# Patient Record
Sex: Female | Born: 1950
Health system: Southern US, Community
[De-identification: ages and names within clinical notes are randomized; demographics above are authoritative.]

## PROBLEM LIST (undated history)

## (undated) DIAGNOSIS — K3532 Acute appendicitis with perforation and localized peritonitis, without abscess: Secondary | ICD-10-CM

## (undated) DIAGNOSIS — I2510 Atherosclerotic heart disease of native coronary artery without angina pectoris: Secondary | ICD-10-CM

## (undated) DIAGNOSIS — E785 Hyperlipidemia, unspecified: Secondary | ICD-10-CM

## (undated) DIAGNOSIS — I7090 Unspecified atherosclerosis: Secondary | ICD-10-CM

## (undated) HISTORY — DX: Hyperlipidemia, unspecified: E78.5

## (undated) HISTORY — DX: Atherosclerotic heart disease of native coronary artery without angina pectoris: I25.10

## (undated) HISTORY — DX: Acute appendicitis with perforation, localized peritonitis, and gangrene, without abscess: K35.32

## (undated) HISTORY — PX: APPENDECTOMY: SHX54

## (undated) HISTORY — DX: Unspecified atherosclerosis: I70.90

## (undated) HISTORY — PX: HERNIA REPAIR: SHX51

## (undated) HISTORY — PX: OTHER SURGICAL HISTORY: SHX169

---

## 1998-08-23 ENCOUNTER — Other Ambulatory Visit: Admission: RE | Admit: 1998-08-23 | Discharge: 1998-08-23 | Payer: Self-pay | Admitting: Obstetrics and Gynecology

## 1999-10-20 ENCOUNTER — Other Ambulatory Visit: Admission: RE | Admit: 1999-10-20 | Discharge: 1999-10-20 | Payer: Self-pay | Admitting: Obstetrics and Gynecology

## 2000-11-11 ENCOUNTER — Other Ambulatory Visit: Admission: RE | Admit: 2000-11-11 | Discharge: 2000-11-11 | Payer: Self-pay | Admitting: Obstetrics and Gynecology

## 2001-11-15 ENCOUNTER — Other Ambulatory Visit: Admission: RE | Admit: 2001-11-15 | Discharge: 2001-11-15 | Payer: Self-pay | Admitting: Obstetrics and Gynecology

## 2002-12-21 ENCOUNTER — Other Ambulatory Visit: Admission: RE | Admit: 2002-12-21 | Discharge: 2002-12-21 | Payer: Self-pay | Admitting: Obstetrics and Gynecology

## 2004-01-02 ENCOUNTER — Other Ambulatory Visit: Admission: RE | Admit: 2004-01-02 | Discharge: 2004-01-02 | Payer: Self-pay | Admitting: Obstetrics and Gynecology

## 2005-02-20 ENCOUNTER — Other Ambulatory Visit: Admission: RE | Admit: 2005-02-20 | Discharge: 2005-02-20 | Payer: Self-pay | Admitting: Obstetrics and Gynecology

## 2010-01-09 ENCOUNTER — Ambulatory Visit (HOSPITAL_COMMUNITY): Admission: AD | Admit: 2010-01-09 | Discharge: 2010-01-11 | Payer: Self-pay | Admitting: General Surgery

## 2010-08-22 ENCOUNTER — Other Ambulatory Visit (HOSPITAL_COMMUNITY): Payer: Self-pay | Admitting: Family Medicine

## 2010-08-22 ENCOUNTER — Other Ambulatory Visit (HOSPITAL_COMMUNITY)
Admission: RE | Admit: 2010-08-22 | Discharge: 2010-08-22 | Disposition: A | Discharge status: Home / Self Care | Payer: 59 | Source: Ambulatory Visit | Attending: Family Medicine | Admitting: Family Medicine

## 2010-08-22 ENCOUNTER — Other Ambulatory Visit: Payer: Self-pay | Admitting: Family Medicine

## 2010-08-22 DIAGNOSIS — Z1231 Encounter for screening mammogram for malignant neoplasm of breast: Secondary | ICD-10-CM

## 2010-08-22 DIAGNOSIS — Z124 Encounter for screening for malignant neoplasm of cervix: Secondary | ICD-10-CM | POA: Insufficient documentation

## 2010-08-22 DIAGNOSIS — Z1239 Encounter for other screening for malignant neoplasm of breast: Secondary | ICD-10-CM

## 2010-09-19 ENCOUNTER — Ambulatory Visit (HOSPITAL_COMMUNITY)
Admission: RE | Admit: 2010-09-19 | Discharge: 2010-09-19 | Disposition: A | Discharge status: Home / Self Care | Payer: 59 | Source: Ambulatory Visit | Attending: Family Medicine | Admitting: Family Medicine

## 2010-09-19 DIAGNOSIS — Z1231 Encounter for screening mammogram for malignant neoplasm of breast: Secondary | ICD-10-CM | POA: Insufficient documentation

## 2010-10-12 LAB — WOUND CULTURE: Culture: NO GROWTH

## 2010-10-12 LAB — CBC
HCT: 34 % — ABNORMAL LOW (ref 36.0–46.0)
MCV: 89 fL (ref 78.0–100.0)
Platelets: 179 10*3/uL (ref 150–400)
RBC: 3.8 MIL/uL — ABNORMAL LOW (ref 3.87–5.11)

## 2010-10-13 LAB — DIFFERENTIAL
Basophils Absolute: 0 10*3/uL (ref 0.0–0.1)
Basophils Relative: 1 % (ref 0–1)
Eosinophils Absolute: 0.2 10*3/uL (ref 0.0–0.7)
Eosinophils Relative: 3 % (ref 0–5)
Lymphocytes Relative: 41 % (ref 12–46)
Monocytes Relative: 7 % (ref 3–12)
Neutrophils Relative %: 48 % (ref 43–77)

## 2010-10-13 LAB — SURGICAL PCR SCREEN: Staphylococcus aureus: NEGATIVE

## 2010-10-13 LAB — CBC
HCT: 39 % (ref 36.0–46.0)
Platelets: 193 10*3/uL (ref 150–400)

## 2010-10-13 LAB — COMPREHENSIVE METABOLIC PANEL
Alkaline Phosphatase: 62 U/L (ref 39–117)
BUN: 17 mg/dL (ref 6–23)
Calcium: 9.5 mg/dL (ref 8.4–10.5)
GFR calc Af Amer: >60 mL/min (ref >60)
Potassium: 4.2 mEq/L (ref 3.5–5.1)
Total Bilirubin: 0.5 mg/dL (ref 0.3–1.2)
Total Protein: 7.2 g/dL (ref 6.0–8.3)

## 2011-08-21 ENCOUNTER — Other Ambulatory Visit (HOSPITAL_COMMUNITY): Payer: Self-pay | Admitting: Family Medicine

## 2011-08-21 DIAGNOSIS — Z1231 Encounter for screening mammogram for malignant neoplasm of breast: Secondary | ICD-10-CM

## 2011-09-21 ENCOUNTER — Ambulatory Visit (HOSPITAL_COMMUNITY)
Admission: RE | Admit: 2011-09-21 | Discharge: 2011-09-21 | Disposition: A | Discharge status: Home / Self Care | Payer: 59 | Source: Ambulatory Visit | Attending: Family Medicine | Admitting: Family Medicine

## 2011-09-21 DIAGNOSIS — Z1231 Encounter for screening mammogram for malignant neoplasm of breast: Secondary | ICD-10-CM | POA: Insufficient documentation

## 2012-09-01 ENCOUNTER — Other Ambulatory Visit (HOSPITAL_COMMUNITY): Payer: Self-pay | Admitting: Family Medicine

## 2012-09-01 DIAGNOSIS — Z1231 Encounter for screening mammogram for malignant neoplasm of breast: Secondary | ICD-10-CM

## 2012-09-30 ENCOUNTER — Ambulatory Visit (HOSPITAL_COMMUNITY): Payer: 59

## 2012-10-07 ENCOUNTER — Ambulatory Visit (HOSPITAL_COMMUNITY)
Admission: RE | Admit: 2012-10-07 | Discharge: 2012-10-07 | Disposition: A | Discharge status: Home / Self Care | Payer: 59 | Source: Ambulatory Visit | Attending: Family Medicine | Admitting: Family Medicine

## 2012-10-07 DIAGNOSIS — Z1231 Encounter for screening mammogram for malignant neoplasm of breast: Secondary | ICD-10-CM | POA: Insufficient documentation

## 2013-09-29 ENCOUNTER — Other Ambulatory Visit (HOSPITAL_COMMUNITY): Payer: Self-pay | Admitting: Family Medicine

## 2013-09-29 ENCOUNTER — Other Ambulatory Visit (HOSPITAL_COMMUNITY)
Admission: RE | Admit: 2013-09-29 | Discharge: 2013-09-29 | Disposition: A | Discharge status: Home / Self Care | Payer: 59 | Source: Ambulatory Visit | Attending: Family Medicine | Admitting: Family Medicine

## 2013-09-29 ENCOUNTER — Other Ambulatory Visit: Payer: Self-pay | Admitting: Family Medicine

## 2013-09-29 DIAGNOSIS — Z1231 Encounter for screening mammogram for malignant neoplasm of breast: Secondary | ICD-10-CM

## 2013-09-29 DIAGNOSIS — Z1151 Encounter for screening for human papillomavirus (HPV): Secondary | ICD-10-CM | POA: Insufficient documentation

## 2013-09-29 DIAGNOSIS — Z124 Encounter for screening for malignant neoplasm of cervix: Secondary | ICD-10-CM | POA: Insufficient documentation

## 2013-10-13 ENCOUNTER — Ambulatory Visit (HOSPITAL_COMMUNITY): Payer: 59

## 2013-10-20 ENCOUNTER — Ambulatory Visit (HOSPITAL_COMMUNITY)
Admission: RE | Admit: 2013-10-20 | Discharge: 2013-10-20 | Disposition: A | Discharge status: Home / Self Care | Payer: 59 | Source: Ambulatory Visit | Attending: Family Medicine | Admitting: Family Medicine

## 2013-10-20 DIAGNOSIS — Z1231 Encounter for screening mammogram for malignant neoplasm of breast: Secondary | ICD-10-CM | POA: Insufficient documentation

## 2014-09-25 ENCOUNTER — Other Ambulatory Visit (HOSPITAL_COMMUNITY): Payer: Self-pay | Admitting: Family Medicine

## 2014-09-25 DIAGNOSIS — Z1231 Encounter for screening mammogram for malignant neoplasm of breast: Secondary | ICD-10-CM

## 2014-10-15 ENCOUNTER — Ambulatory Visit
Admission: RE | Admit: 2014-10-15 | Discharge: 2014-10-15 | Disposition: A | Discharge status: Home / Self Care | Payer: 59 | Source: Ambulatory Visit | Attending: Family Medicine | Admitting: Family Medicine

## 2014-10-15 ENCOUNTER — Other Ambulatory Visit: Payer: Self-pay | Admitting: Family Medicine

## 2014-10-15 DIAGNOSIS — R05 Cough: Secondary | ICD-10-CM

## 2014-10-15 DIAGNOSIS — R059 Cough, unspecified: Secondary | ICD-10-CM

## 2014-10-26 ENCOUNTER — Ambulatory Visit (HOSPITAL_COMMUNITY): Payer: Self-pay

## 2014-10-26 ENCOUNTER — Ambulatory Visit (HOSPITAL_COMMUNITY)
Admission: RE | Admit: 2014-10-26 | Discharge: 2014-10-26 | Disposition: A | Discharge status: Home / Self Care | Payer: 59 | Source: Ambulatory Visit | Attending: Family Medicine | Admitting: Family Medicine

## 2014-10-26 DIAGNOSIS — Z1231 Encounter for screening mammogram for malignant neoplasm of breast: Secondary | ICD-10-CM | POA: Diagnosis not present

## 2015-02-01 ENCOUNTER — Other Ambulatory Visit: Payer: Self-pay | Admitting: Gastroenterology

## 2015-04-11 ENCOUNTER — Other Ambulatory Visit (HOSPITAL_COMMUNITY): Payer: Self-pay | Admitting: Family Medicine

## 2015-04-11 DIAGNOSIS — Z122 Encounter for screening for malignant neoplasm of respiratory organs: Secondary | ICD-10-CM

## 2015-04-11 DIAGNOSIS — F17211 Nicotine dependence, cigarettes, in remission: Secondary | ICD-10-CM

## 2015-10-16 ENCOUNTER — Other Ambulatory Visit: Payer: Self-pay

## 2015-10-16 DIAGNOSIS — Z1231 Encounter for screening mammogram for malignant neoplasm of breast: Secondary | ICD-10-CM

## 2015-10-21 ENCOUNTER — Other Ambulatory Visit: Payer: Self-pay | Admitting: Family Medicine

## 2015-10-21 DIAGNOSIS — R9389 Abnormal findings on diagnostic imaging of other specified body structures: Secondary | ICD-10-CM

## 2015-11-08 ENCOUNTER — Ambulatory Visit
Admission: RE | Admit: 2015-11-08 | Discharge: 2015-11-08 | Disposition: A | Discharge status: Home / Self Care | Payer: 59 | Source: Ambulatory Visit | Attending: Family Medicine | Admitting: Family Medicine

## 2015-11-08 ENCOUNTER — Ambulatory Visit
Admission: RE | Admit: 2015-11-08 | Discharge: 2015-11-08 | Disposition: A | Discharge status: Home / Self Care | Payer: 59 | Source: Ambulatory Visit

## 2015-11-08 DIAGNOSIS — R9389 Abnormal findings on diagnostic imaging of other specified body structures: Secondary | ICD-10-CM

## 2015-11-08 DIAGNOSIS — Z1231 Encounter for screening mammogram for malignant neoplasm of breast: Secondary | ICD-10-CM

## 2015-11-25 ENCOUNTER — Ambulatory Visit (INDEPENDENT_AMBULATORY_CARE_PROVIDER_SITE_OTHER): Payer: 59 | Admitting: Internal Medicine

## 2015-11-25 ENCOUNTER — Encounter: Payer: Self-pay | Admitting: Internal Medicine

## 2015-11-25 VITALS — BP 120/60 | HR 64 | Ht 67.0 in | Wt 191.0 lb

## 2015-11-25 DIAGNOSIS — I2510 Atherosclerotic heart disease of native coronary artery without angina pectoris: Secondary | ICD-10-CM

## 2015-11-25 MED ORDER — ATORVASTATIN CALCIUM 20 MG PO TABS: 20.0000 mg | ORAL_TABLET | Freq: Every day | ORAL | Status: DC

## 2015-11-25 MED ORDER — ASPIRIN EC 81 MG PO TBEC: 81.0000 mg | DELAYED_RELEASE_TABLET | Freq: Every day | ORAL | Status: AC

## 2015-11-25 NOTE — Progress Notes (Signed)
Cardiology Office Note   Date:  11/25/2015   ID:  Heather Weaver, DOB 21-Oct-1950, MRN [id number]  PCP:  Jonathon Bellows, MD  Cardiologist:   Dorris Carnes, MD   Chief Complaint  Patient presents with  . New Patient (Initial Visit)    referal for CAD    Abnormal chest CT  Referred by Dr Justin Mend     History of Present Illness: Heather Weaver is a 65 y.o. female with a history of abnormal CT  CT done for  F/u for a granuloma incidentaly found calcifications of arteries  8mod to severe)   Dizzy this weekend after long day at [facility] to [facility]     CXR and lab work neg  Felt to be due to dehydration  Given fluid and 4 ASA    Activites Master gardener volunteer   House work  No problems with that  NO SOB   No CP        No outpatient prescriptions prior to visit.   No facility-administered medications prior to visit.     Allergies:   Nickel   Past Medical History  Diagnosis Date  . Coronary artery disease   . Ruptured appendix   . Hyperlipidemia   . Atherosclerosis     Past Surgical History  Procedure Laterality Date  . Appendectomy    . Hernia repair    . Colocscopy       Social History:  The patient  reports that she has quit smoking. She does not have any smokeless tobacco history on file.   Family History:  The patient's family history includes Colon cancer (age of onset: 51) in her brother; Emphysema in her mother; Healthy in her sister; Heart failure (age of onset: 27) in her father; Rheumatic fever (age of onset: 78) in her father.    ROS:  Please see the history of present illness. All other systems are reviewed and  Negative to the above problem except as noted.    PHYSICAL EXAM: VS:  BP 120/60 mmHg  Pulse 64  Ht 5\' 7"  (1.702 m)  Wt 191 lb (86.637 kg)  BMI 29.91 kg/m2  GEN: Well nourished, well developed, in no acute distress HEENT: normal Neck: no JVD, carotid bruits, or masses Cardiac: RRR; no murmurs, rubs, or gallops,no edema    Respiratory:  clear to auscultation bilaterally, normal work of breathing GI: soft, nontender, nondistended, + BS  No hepatomegaly  MS: no deformity Moving all extremities   Skin: warm and dry, no rash Neuro:  Strength and sensation are intact Psych: euthymic mood, full affect   EKG:  EKG is ordered today.  SB 58 bpm     Lipid Panel No results found for: CHOL, TRIG, HDL, CHOLHDL, VLDL, LDLCALC, LDLDIRECT    Wt Readings from Last 3 Encounters:  11/25/15 191 lb (86.637 kg)      ASSESSMENT AND PLAN:  1  CAD  Pt with evid of CAD on chest CT  She does not appear to haave angina  Concerned about silent problems  ? If energy level ok I would recomm stress myovue to evaluate With CAD she should be on ecASA 81 mg   2  HL  LDL was 133 on recent check  SHould be down around 70  Would recomm lipitor 20 mg  F?U lipids in 8 wks with AST    F/U tentatively next winter     Signed, Dorris Carnes, MD  11/25/2015 5:04  PM    [facility] [place], [place], [place]  [address] Phone: [phone]; Fax: [phone]

## 2015-11-25 NOTE — Patient Instructions (Addendum)
Your physician has recommended you make the following change in your medication:  1.) start Lipitor (atorvastatin) 20 mg once a day in the evening (for cholesterol/CAD) 2.) stop aspirin 325 mg  3.) start aspirin 81 mg once a day  Your physician recommends that you return for lab work in: 8 weeks after starting Lipitor (lipids)  Your physician has requested that you have en exercise stress myoview. For further information please visit [email or web address]. Please follow instruction sheet, as given.  Your physician wants you to follow-up in: 6 months with Dr. Harrington Challenger.  You will receive a reminder letter in the mail two months in advance. If you don't receive a letter, please call our office to schedule the follow-up appointment.

## 2015-12-11 ENCOUNTER — Telehealth (HOSPITAL_COMMUNITY): Payer: Self-pay | Admitting: *Deleted

## 2015-12-11 NOTE — Telephone Encounter (Signed)
Patient given detailed instructions per Myocardial Perfusion Study Information Sheet for the test on 12/16/15. Patient notified to arrive 15 minutes early and that it is imperative to arrive on time for appointment to keep from having the test rescheduled.  If you need to cancel or reschedule your appointment, please call the office within 24 hours of your appointment. Failure to do so may result in a cancellation of your appointment, and a $50 no show fee. Patient verbalized understanding.Deania Siguenza J Michelangelo Rindfleisch, RN  

## 2015-12-16 ENCOUNTER — Ambulatory Visit (HOSPITAL_COMMUNITY): Payer: 59 | Attending: Cardiology

## 2015-12-16 DIAGNOSIS — I2510 Atherosclerotic heart disease of native coronary artery without angina pectoris: Secondary | ICD-10-CM | POA: Diagnosis not present

## 2015-12-16 DIAGNOSIS — R42 Dizziness and giddiness: Secondary | ICD-10-CM | POA: Insufficient documentation

## 2015-12-16 LAB — MYOCARDIAL PERFUSION IMAGING
CHL CUP NUCLEAR SDS: 0
CHL CUP NUCLEAR SRS: 0
CHL CUP RESTING HR STRESS: 58 {beats}/min
CSEPED: 6 min
CSEPEDS: 0 s
Estimated workload: 7 METS
LV dias vol: 148 mL (ref 46–106)
LV sys vol: 75 mL
MPHR: 156 {beats}/min
NUC STRESS TID: 1.06
Peak HR: 142 {beats}/min
Percent HR: 91 %
RATE: 0.23
RPE: 18
SSS: 0

## 2015-12-16 MED ORDER — TECHNETIUM TC 99M TETROFOSMIN IV KIT
11.0000 | PACK | Freq: Once | INTRAVENOUS | Status: AC | PRN
  Administered 2015-12-16: 11 via INTRAVENOUS
  Filled 2015-12-16: qty 11

## 2015-12-16 MED ORDER — TECHNETIUM TC 99M TETROFOSMIN IV KIT
32.5000 | PACK | Freq: Once | INTRAVENOUS | Status: AC | PRN
  Administered 2015-12-16: 33 via INTRAVENOUS
  Filled 2015-12-16: qty 33

## 2015-12-19 ENCOUNTER — Telehealth: Payer: Self-pay | Admitting: Internal Medicine

## 2015-12-19 DIAGNOSIS — I2510 Atherosclerotic heart disease of native coronary artery without angina pectoris: Secondary | ICD-10-CM

## 2015-12-19 NOTE — Telephone Encounter (Signed)
F/u  Pt returning RN phone call- myoview results. Please call back and discuss.   

## 2015-12-20 NOTE — Telephone Encounter (Signed)
Late entry for 12/19/15.  Pt informed of stress test results.  Notes Recorded by Fay Records, MD on 12/17/2015 at 4:07 PM Stress test shows normal flow at both rest and stress Pumping function was difficult to accurately calculate I would confirm LV pumping function with echo  Keep on same meds     We discussed lifestyle changes including low fat diet and exercise--including a walking plan. She would like a nutrition consult to discuss any possible diet changes. Order placed for referral to nutrition.  To Dr. Harrington Challenger for approval/any other recommendations.

## 2015-12-25 ENCOUNTER — Other Ambulatory Visit: Payer: Self-pay | Admitting: *Deleted

## 2015-12-25 DIAGNOSIS — I519 Heart disease, unspecified: Secondary | ICD-10-CM

## 2016-01-10 ENCOUNTER — Other Ambulatory Visit: Payer: Self-pay

## 2016-01-10 ENCOUNTER — Ambulatory Visit (HOSPITAL_COMMUNITY): Payer: 59 | Attending: Internal Medicine

## 2016-01-10 DIAGNOSIS — I509 Heart failure, unspecified: Secondary | ICD-10-CM | POA: Diagnosis not present

## 2016-01-10 DIAGNOSIS — Z8249 Family history of ischemic heart disease and other diseases of the circulatory system: Secondary | ICD-10-CM | POA: Insufficient documentation

## 2016-01-10 DIAGNOSIS — E785 Hyperlipidemia, unspecified: Secondary | ICD-10-CM | POA: Insufficient documentation

## 2016-01-10 DIAGNOSIS — I340 Nonrheumatic mitral (valve) insufficiency: Secondary | ICD-10-CM | POA: Diagnosis not present

## 2016-01-10 DIAGNOSIS — I5189 Other ill-defined heart diseases: Secondary | ICD-10-CM | POA: Insufficient documentation

## 2016-01-10 DIAGNOSIS — I071 Rheumatic tricuspid insufficiency: Secondary | ICD-10-CM | POA: Diagnosis not present

## 2016-01-10 DIAGNOSIS — Z87891 Personal history of nicotine dependence: Secondary | ICD-10-CM | POA: Insufficient documentation

## 2016-01-10 DIAGNOSIS — I358 Other nonrheumatic aortic valve disorders: Secondary | ICD-10-CM | POA: Diagnosis not present

## 2016-01-10 DIAGNOSIS — I519 Heart disease, unspecified: Secondary | ICD-10-CM | POA: Diagnosis not present

## 2016-01-10 DIAGNOSIS — I2510 Atherosclerotic heart disease of native coronary artery without angina pectoris: Secondary | ICD-10-CM | POA: Diagnosis not present

## 2016-01-10 DIAGNOSIS — I351 Nonrheumatic aortic (valve) insufficiency: Secondary | ICD-10-CM | POA: Insufficient documentation

## 2016-01-21 ENCOUNTER — Other Ambulatory Visit (INDEPENDENT_AMBULATORY_CARE_PROVIDER_SITE_OTHER): Payer: 59

## 2016-01-21 DIAGNOSIS — I2510 Atherosclerotic heart disease of native coronary artery without angina pectoris: Secondary | ICD-10-CM

## 2016-01-21 LAB — LIPID PANEL
CHOL/HDL RATIO: 2.5 ratio (ref <=5.0)
Cholesterol: 138 mg/dL (ref 125–200)
HDL: 55 mg/dL (ref >=46)
LDL Cholesterol: 71 mg/dL (ref <130)
Triglycerides: 60 mg/dL (ref <150)
VLDL: 12 mg/dL (ref <30)

## 2016-01-22 ENCOUNTER — Other Ambulatory Visit: Payer: Self-pay | Admitting: *Deleted

## 2016-01-22 DIAGNOSIS — I2510 Atherosclerotic heart disease of native coronary artery without angina pectoris: Secondary | ICD-10-CM

## 2016-01-22 MED ORDER — ATORVASTATIN CALCIUM 20 MG PO TABS: 20.0000 mg | ORAL_TABLET | Freq: Every day | ORAL | Status: DC

## 2016-10-22 ENCOUNTER — Other Ambulatory Visit: Payer: Self-pay | Admitting: Family Medicine

## 2016-10-22 DIAGNOSIS — Z1231 Encounter for screening mammogram for malignant neoplasm of breast: Secondary | ICD-10-CM

## 2016-10-30 ENCOUNTER — Other Ambulatory Visit: Payer: Self-pay | Admitting: Family Medicine

## 2016-10-30 ENCOUNTER — Other Ambulatory Visit (HOSPITAL_COMMUNITY)
Admission: RE | Admit: 2016-10-30 | Discharge: 2016-10-30 | Disposition: A | Discharge status: Home / Self Care | Payer: Medicare Other | Source: Ambulatory Visit | Attending: Family Medicine | Admitting: Family Medicine

## 2016-10-30 DIAGNOSIS — Z01411 Encounter for gynecological examination (general) (routine) with abnormal findings: Secondary | ICD-10-CM | POA: Insufficient documentation

## 2016-10-30 DIAGNOSIS — Z1151 Encounter for screening for human papillomavirus (HPV): Secondary | ICD-10-CM | POA: Diagnosis present

## 2016-11-03 LAB — CYTOLOGY - PAP
Diagnosis: NEGATIVE
HPV: NOT DETECTED

## 2016-11-04 ENCOUNTER — Encounter: Payer: Self-pay | Admitting: Internal Medicine

## 2016-11-16 ENCOUNTER — Ambulatory Visit
Admission: RE | Admit: 2016-11-16 | Discharge: 2016-11-16 | Disposition: A | Discharge status: Home / Self Care | Payer: Medicare Other | Source: Ambulatory Visit | Attending: Family Medicine | Admitting: Family Medicine

## 2016-11-16 DIAGNOSIS — Z1231 Encounter for screening mammogram for malignant neoplasm of breast: Secondary | ICD-10-CM

## 2016-11-23 ENCOUNTER — Encounter: Payer: Self-pay | Admitting: Internal Medicine

## 2016-11-23 ENCOUNTER — Ambulatory Visit (INDEPENDENT_AMBULATORY_CARE_PROVIDER_SITE_OTHER): Payer: Medicare Other | Admitting: Internal Medicine

## 2016-11-23 VITALS — BP 112/62 | HR 70 | Ht 67.0 in | Wt 193.0 lb

## 2016-11-23 DIAGNOSIS — I2510 Atherosclerotic heart disease of native coronary artery without angina pectoris: Secondary | ICD-10-CM

## 2016-11-23 DIAGNOSIS — E782 Mixed hyperlipidemia: Secondary | ICD-10-CM

## 2016-11-23 NOTE — Patient Instructions (Signed)
Your physician recommends that you continue on your current medications as directed. Please refer to the Current Medication list given to you today. Your physician wants you to follow-up in: 1 YEAR WITH DR. ROSS.  You will receive a reminder letter in the mail two months in advance. If you don't receive a letter, please call our office to schedule the follow-up appointment.  

## 2016-11-23 NOTE — Progress Notes (Addendum)
Cardiology Office Note   Date:  11/23/2016   ID:  Heather Weaver, DOB 10/09/1950, MRN [id number]  PCP:  Jonathon Bellows, MD  Cardiologist:   Dorris Carnes, MD   F/U of coronary calcifications       History of Present Illness: Heather Weaver is a 66 y.o. female with a history of abnormal CT  CT done for F/u of  a granuloma and coincidentaly found calcifications of arteries  (mod to severe) Pt had a myovue scan that showed norma perfusion  Echo showed normal pumping funciton    Since seen she deneis CP  Breathing is OK   She was at IM at [facility]  EKG was done  She was bradycardc with HR in 40s  Pt denies dizziness    Outpatient Medications Prior to Visit  Medication Sig Dispense Refill  . aspirin EC 81 MG tablet Take 1 tablet (81 mg total) by mouth daily. 90 tablet 3  . atorvastatin (LIPITOR) 20 MG tablet Take 1 tablet (20 mg total) by mouth daily. 90 tablet 3  . ibuprofen (ADVIL,MOTRIN) 200 MG tablet Take 200 mg by mouth as needed for headache or mild pain.     No facility-administered medications prior to visit.      Allergies:   Nickel   Past Medical History:  Diagnosis Date  . Atherosclerosis   . Coronary artery disease   . Hyperlipidemia   . Ruptured appendix     Past Surgical History:  Procedure Laterality Date  . APPENDECTOMY    . colocscopy    . HERNIA REPAIR       Social History:  The patient  reports that she has quit smoking. She has never used smokeless tobacco. She reports that she drinks alcohol. She reports that she does not use drugs.   Family History:  The patient's family history includes Colon cancer (age of onset: 60) in her brother; Emphysema in her mother; Healthy in her sister; Heart failure (age of onset: 35) in her father; Rheumatic fever (age of onset: 47) in her father.    ROS:  Please see the history of present illness. All other systems are reviewed and  Negative to the above problem except as noted.    PHYSICAL EXAM: VS:  BP 112/62   Pulse  70   Ht 5\' 7"  (1.702 m)   Wt 193 lb (87.5 kg)   SpO2 96%   BMI 30.23 kg/m   GEN: Well nourished, well developed, in no acute distress HEENT: normal Neck: no JVD, carotid bruits, or masses Cardiac: RRR; no murmurs, rubs, or gallops,no edema  Respiratory:  clear to auscultation bilaterally, normal work of breathing GI: soft, nontender, nondistended, + BS  No hepatomegaly  MS: no deformity Moving all extremities   Skin: warm and dry, no rash Neuro:  Strength and sensation are intact Psych: euthymic mood, full affect   EKG:  EKG is ordered today.  SB 58 bpm     Lipid Panel    Component Value Date/Time   CHOL 138 01/21/2016 0825   TRIG 60 01/21/2016 0825   HDL 55 01/21/2016 0825   CHOLHDL 2.5 01/21/2016 0825   VLDL 12 01/21/2016 0825   LDLCALC 71 01/21/2016 0825      Wt Readings from Last 3 Encounters:  11/23/16 193 lb (87.5 kg)  12/16/15 191 lb (86.6 kg)  11/25/15 191 lb (86.6 kg)      ASSESSMENT AND PLAN:  1  CAD  Pt with evid of  CAD on chest CT  No symptoms to sugg angina  Keep on same meds   2  HL  LDL was better  Keep on same regimne   3  Bradycardia  Pt asympotmatic  I would pursue anty testing unless dizzy, SOB F/U tentatively in 1 year   Signed, Dorris Carnes, MD  11/23/2016 3:12 PM    [facility] [place], [place], [place]  [address] Phone: [phone]; Fax: [phone]

## 2017-02-08 ENCOUNTER — Other Ambulatory Visit: Payer: Self-pay | Admitting: Internal Medicine

## 2017-02-08 DIAGNOSIS — I2510 Atherosclerotic heart disease of native coronary artery without angina pectoris: Secondary | ICD-10-CM

## 2017-10-04 ENCOUNTER — Other Ambulatory Visit: Payer: Self-pay | Admitting: Family Medicine

## 2017-10-04 DIAGNOSIS — Z1231 Encounter for screening mammogram for malignant neoplasm of breast: Secondary | ICD-10-CM

## 2017-11-07 ENCOUNTER — Other Ambulatory Visit: Payer: Self-pay | Admitting: Internal Medicine

## 2017-11-07 DIAGNOSIS — I2510 Atherosclerotic heart disease of native coronary artery without angina pectoris: Secondary | ICD-10-CM

## 2017-11-22 ENCOUNTER — Ambulatory Visit
Admission: RE | Admit: 2017-11-22 | Discharge: 2017-11-22 | Disposition: A | Discharge status: Home / Self Care | Payer: Medicare Other | Source: Ambulatory Visit | Attending: Family Medicine | Admitting: Family Medicine

## 2017-11-22 DIAGNOSIS — Z1231 Encounter for screening mammogram for malignant neoplasm of breast: Secondary | ICD-10-CM

## 2017-12-14 ENCOUNTER — Other Ambulatory Visit: Payer: Self-pay

## 2017-12-14 DIAGNOSIS — I2510 Atherosclerotic heart disease of native coronary artery without angina pectoris: Secondary | ICD-10-CM

## 2017-12-14 MED ORDER — ATORVASTATIN CALCIUM 20 MG PO TABS: 20.0000 mg | ORAL_TABLET | Freq: Every day | ORAL | 0 refills | Status: DC

## 2017-12-16 ENCOUNTER — Other Ambulatory Visit: Payer: Self-pay

## 2017-12-16 DIAGNOSIS — I2510 Atherosclerotic heart disease of native coronary artery without angina pectoris: Secondary | ICD-10-CM

## 2017-12-16 MED ORDER — ATORVASTATIN CALCIUM 20 MG PO TABS: 20.0000 mg | ORAL_TABLET | Freq: Every day | ORAL | 0 refills | Status: DC

## 2018-01-16 ENCOUNTER — Ambulatory Visit (HOSPITAL_COMMUNITY)
Admission: EM | Admit: 2018-01-16 | Discharge: 2018-01-16 | Disposition: A | Discharge status: Home / Self Care | Payer: Medicare Other | Attending: Family Medicine | Admitting: Family Medicine

## 2018-01-16 ENCOUNTER — Encounter (HOSPITAL_COMMUNITY): Payer: Self-pay

## 2018-01-16 ENCOUNTER — Other Ambulatory Visit: Payer: Self-pay

## 2018-01-16 DIAGNOSIS — R21 Rash and other nonspecific skin eruption: Secondary | ICD-10-CM | POA: Diagnosis not present

## 2018-01-16 MED ORDER — TRIAMCINOLONE ACETONIDE 0.1 % EX CREA: 1.0000 "application " | TOPICAL_CREAM | Freq: Two times a day (BID) | CUTANEOUS | 0 refills | Status: DC

## 2018-01-16 MED ORDER — CETIRIZINE HCL 10 MG PO TABS
10.0000 mg | ORAL_TABLET | Freq: Every day | ORAL | 0 refills | Status: DC
Start: 2018-01-16 — End: 2018-04-08

## 2018-01-16 NOTE — ED Triage Notes (Signed)
p presents to [facility] for possible reaction to insect bite since last night, pt has a red raised patch on left arm. Pt has applied neosporin on bite, has no relief

## 2018-01-16 NOTE — ED Notes (Signed)
Pt discharged by provider.

## 2018-01-16 NOTE — ED Provider Notes (Signed)
[facility]    CSN: [id number] Arrival date & time: 01/16/18  1336     History   Chief Complaint Chief Complaint  Patient presents with  . Insect Bite    HPI Heather Weaver is a 67 y.o. female.   67 year old female comes in for swelling, erythema, increased warmth of the left upper arm.  States she has had multiple insect bites on the body, and unsure if that was from a insect bite.  Mild itching.  States woke up this morning and noticed the swelling and erythema.  Area is tender to touch.  States since this morning, has slightly spread with more defined borders.  She denies fever, chills, night sweats.  Denies open wound.  Applied Neosporin to the area.     Past Medical History:  Diagnosis Date  . Atherosclerosis   . Coronary artery disease   . Hyperlipidemia   . Ruptured appendix     There are no active problems to display for this patient.   Past Surgical History:  Procedure Laterality Date  . APPENDECTOMY    . colocscopy    . HERNIA REPAIR      OB History   None      Home Medications    Prior to Admission medications   Medication Sig Start Date End Date Taking? Authorizing Provider  aspirin EC 81 MG tablet Take 1 tablet (81 mg total) by mouth daily. 11/25/15  Yes Fay Records, MD  atorvastatin (LIPITOR) 20 MG tablet Take 1 tablet (20 mg total) by mouth daily. Please keep upcoming appt 12/16/17  Yes Fay Records, MD  ibuprofen (ADVIL,MOTRIN) 200 MG tablet Take 200 mg by mouth as needed for headache or mild pain.   Yes [provider]  cetirizine (ZYRTEC) 10 MG tablet Take 1 tablet (10 mg total) by mouth daily. 01/16/18   Tasia Catchings, Breely Panik V, PA-C  triamcinolone cream (KENALOG) 0.1 % Apply 1 application topically 2 (two) times daily. 01/16/18   Ok Edwards, PA-C    Family History Family History  Problem Relation Age of Onset  . Emphysema Mother   . Heart failure Father 4       do to rheumastic fever  . Rheumatic fever Father 29  . Colon cancer  Brother 26  . Healthy Sister        age 30    Social History Social History   Tobacco Use  . Smoking status: Former Research scientist (life sciences)  . Smokeless tobacco: Never Used  Substance Use Topics  . Alcohol use: Yes    Alcohol/week: 0.0 oz  . Drug use: No     Allergies   Nickel   Review of Systems Review of Systems  Reason unable to perform ROS: See HPI as above.     Physical Exam Triage Vital Signs ED Triage Vitals  Enc Vitals Group     BP 01/16/18 1420 121/67     Pulse Rate 01/16/18 1420 60     Resp 01/16/18 1420 16     Temp 01/16/18 1420 98.4 F (36.9 C)     Temp Source 01/16/18 1420 Oral     SpO2 01/16/18 1420 98 %     Weight --      Height --      Head Circumference --      Peak Flow --      Pain Score 01/16/18 1421 0     Pain Loc --      Pain Edu? --  Excl. in GC? --    No data found.  Updated Vital Signs BP 121/67 (BP Location: Left Arm)   Pulse 60   Temp 98.4 F (36.9 C) (Oral)   Resp 16   SpO2 98%   Physical Exam  Constitutional: She is oriented to person, place, and time. She appears well-developed and well-nourished. No distress.  HENT:  Head: Normocephalic and atraumatic.  Eyes: Pupils are equal, round, and reactive to light. Conjunctivae are normal.  Neurological: She is alert and oriented to person, place, and time.  Skin: Skin is warm and dry.  See picture below.  Mild increased warmth.  No fluctuance felt.  Mild tenderness to palpation.          UC Treatments / Results  Labs (all labs ordered are listed, but only abnormal results are displayed) Labs Reviewed - No data to display  EKG None  Radiology No results found.  Procedures Procedures (including critical care time)  Medications Ordered in UC Medications - No data to display  Initial Impression / Assessment and Plan / UC Course  I have reviewed the triage vital signs and the nursing notes.  Pertinent labs & imaging results that were available during my care of the  patient were reviewed by me and considered in my medical decision making (see chart for details).    Discussed case with Dr. Joseph Art.  Area more concerning for local reaction versus cellulitis.  Will start Zyrtec, triamcinolone cream, ice compress.  Patient to continue to observe for streaking, worsening symptoms.  Other return precautions discussed.  Patient expresses understanding and agrees to plan.  Final Clinical Impressions(s) / UC Diagnoses   Final diagnoses:  Rash    ED Prescriptions    Medication Sig Dispense Auth. Provider   cetirizine (ZYRTEC) 10 MG tablet Take 1 tablet (10 mg total) by mouth daily. 15 tablet Shamanda Len V, PA-C   triamcinolone cream (KENALOG) 0.1 % Apply 1 application topically 2 (two) times daily. 30 g Tobin Chad, [place] 01/16/18 1802

## 2018-01-16 NOTE — Discharge Instructions (Addendum)
Affected region looks more like an allergic reaction/inflammation. Start zyrtec as directed. Triamcinolone cream. Ice compress. Follow up for reevaluation redness continues to spread, has streaking lines, fever.

## 2018-03-17 ENCOUNTER — Other Ambulatory Visit: Payer: Self-pay | Admitting: Internal Medicine

## 2018-03-17 ENCOUNTER — Encounter: Payer: Self-pay | Admitting: Internal Medicine

## 2018-03-17 DIAGNOSIS — I2510 Atherosclerotic heart disease of native coronary artery without angina pectoris: Secondary | ICD-10-CM

## 2018-04-08 ENCOUNTER — Ambulatory Visit (INDEPENDENT_AMBULATORY_CARE_PROVIDER_SITE_OTHER): Payer: Medicare Other | Admitting: Internal Medicine

## 2018-04-08 ENCOUNTER — Encounter: Payer: Self-pay | Admitting: Internal Medicine

## 2018-04-08 VITALS — BP 124/70 | HR 53 | Ht 67.0 in | Wt 181.6 lb

## 2018-04-08 DIAGNOSIS — E782 Mixed hyperlipidemia: Secondary | ICD-10-CM

## 2018-04-08 DIAGNOSIS — I2510 Atherosclerotic heart disease of native coronary artery without angina pectoris: Secondary | ICD-10-CM | POA: Diagnosis not present

## 2018-04-08 MED ORDER — ATORVASTATIN CALCIUM 20 MG PO TABS: 20.0000 mg | ORAL_TABLET | Freq: Every day | ORAL | 3 refills | Status: DC

## 2018-04-08 NOTE — Progress Notes (Signed)
Cardiology Office Note   Date:  04/08/2018   ID:  Heather Weaver, DOB Jul 22, 1951, MRN [id number]  PCP:  Maurice Small, MD  Cardiologist:   Dorris Carnes, MD   F/U of coronary calcifications       History of Present Illness: Heather Weaver is a 67 y.o. female with a history of abnormal CT  CT done for F/u of  a granuloma and coincidentaly found calcifications of arteries  (mod to severe) Pt had a myovue scan that showed norma perfusion  Echo showed normal pumping funciton    Since seen in 2018 she hs done well  No CP  Breathing is OK   No SOB   Active in gardeing   Has stairs to climb  Outpatient Medications Prior to Visit  Medication Sig Dispense Refill  . aspirin EC 81 MG tablet Take 1 tablet (81 mg total) by mouth daily. 90 tablet 3  . atorvastatin (LIPITOR) 20 MG tablet Take 1 tablet (20 mg total) by mouth daily. Please keep upcoming appointment for further refills 90 tablet 0  . cetirizine (ZYRTEC) 5 MG tablet Take 5 mg by mouth daily as needed for allergies.    Marland Kitchen ibuprofen (ADVIL,MOTRIN) 200 MG tablet Take 200 mg by mouth as needed for headache or mild pain.    . cetirizine (ZYRTEC) 10 MG tablet Take 1 tablet (10 mg total) by mouth daily. (Patient not taking: Reported on 04/08/2018) 15 tablet 0  . triamcinolone cream (KENALOG) 0.1 % Apply 1 application topically 2 (two) times daily. (Patient not taking: Reported on 04/08/2018) 30 g 0   No facility-administered medications prior to visit.      Allergies:   Nickel   Past Medical History:  Diagnosis Date  . Atherosclerosis   . Coronary artery disease   . Hyperlipidemia   . Ruptured appendix     Past Surgical History:  Procedure Laterality Date  . APPENDECTOMY    . colocscopy    . HERNIA REPAIR       Social History:  The patient  reports that she has quit smoking. She has never used smokeless tobacco. She reports that she drinks alcohol. She reports that she does not use drugs.   Family History:  The patient's family  history includes Colon cancer (age of onset: 19) in her brother; Emphysema in her mother; Healthy in her sister; Heart failure (age of onset: 63) in her father; Rheumatic fever (age of onset: 65) in her father.    ROS:  Please see the history of present illness. All other systems are reviewed and  Negative to the above problem except as noted.    PHYSICAL EXAM: VS:  BP 124/70   Pulse (!) 53   Ht 5\' 7"  (1.702 m)   Wt 181 lb 9.6 oz (82.4 kg)   BMI 28.44 kg/m   GEN: Well nourished, well developed, in no acute distress HEENT: normal Neck: JVP is normal  No, carotid bruits, or masses Cardiac: RRR; no murmurs, rubs, or gallops,no edema  Respiratory:  clear to auscultation bilaterally, normal work of breathing GI: soft, nontender, nondistended, + BS  No hepatomegaly  MS: no deformity Moving all extremities   Skin: warm and dry, no rash Neuro:  Strength and sensation are intact Psych: euthymic mood, full affect   EKG:  EKG is ordered today.  SB 53 bpm     Lipid Panel    Component Value Date/Time   CHOL 138 01/21/2016 0825   TRIG 60 01/21/2016  0825   HDL 55 01/21/2016 0825   CHOLHDL 2.5 01/21/2016 0825   VLDL 12 01/21/2016 0825   LDLCALC 71 01/21/2016 0825      Wt Readings from Last 3 Encounters:  04/08/18 181 lb 9.6 oz (82.4 kg)  11/23/16 193 lb (87.5 kg)  12/16/15 191 lb (86.6 kg)      ASSESSMENT AND PLAN:  1  CAD  Mod to severe calc ofcoronary arteries   No symtpoms of angina   Follow  Cont current meds   Will check to see if CBCdone   2  HL  LDL is good   68   HDL 64   Continue meds     3  Bradycardia  Pt asympotmatic Follow    F/U tentatively in 1 year   Signed, Dorris Carnes, MD  04/08/2018 10:25 AM    [facility] [place], [place], [place]  [address] Phone: [phone]; Fax: [phone]

## 2018-04-08 NOTE — Patient Instructions (Signed)
Your physician recommends that you continue on your current medications as directed. Please refer to the Current Medication list given to you today. Your physician wants you to follow-up in: 1 year with Dr. Ross.  You will receive a reminder letter in the mail two months in advance. If you don't receive a letter, please call our office to schedule the follow-up appointment.  

## 2018-12-30 ENCOUNTER — Other Ambulatory Visit: Payer: Self-pay

## 2018-12-30 ENCOUNTER — Ambulatory Visit
Admission: RE | Admit: 2018-12-30 | Discharge: 2018-12-30 | Disposition: A | Discharge status: Home / Self Care | Payer: Medicare Other | Source: Ambulatory Visit

## 2018-12-30 DIAGNOSIS — Z1231 Encounter for screening mammogram for malignant neoplasm of breast: Secondary | ICD-10-CM

## 2019-05-06 ENCOUNTER — Other Ambulatory Visit: Payer: Self-pay | Admitting: Internal Medicine

## 2019-05-06 DIAGNOSIS — I2510 Atherosclerotic heart disease of native coronary artery without angina pectoris: Secondary | ICD-10-CM

## 2019-07-06 ENCOUNTER — Encounter: Payer: Self-pay | Admitting: Internal Medicine

## 2019-07-06 ENCOUNTER — Other Ambulatory Visit: Payer: Self-pay

## 2019-07-06 ENCOUNTER — Ambulatory Visit (INDEPENDENT_AMBULATORY_CARE_PROVIDER_SITE_OTHER): Payer: Medicare Other | Admitting: Internal Medicine

## 2019-07-06 VITALS — BP 140/68 | HR 73 | Ht 67.0 in | Wt 185.0 lb

## 2019-07-06 DIAGNOSIS — I2510 Atherosclerotic heart disease of native coronary artery without angina pectoris: Secondary | ICD-10-CM

## 2019-07-06 NOTE — Progress Notes (Signed)
Cardiology Office Note   Date:  07/06/2019   ID:  Heather Weaver, DOB 1950/11/22, MRN [id number]  PCP:  Maurice Small, MD  Cardiologist:   Dorris Carnes, MD   F/U of coronary calcifications       History of Present Illness: Heather Weaver is a 67 y.o. female with a history of abnormal CT  CT done for F/u of  a granuloma and coincidentaly found calcifications of arteries  (mod to severe) Pt had a myovue scan that showed norma perfusion  Echo showed normal pumping funciton    Since she was last in clinic she has done well.  She is walking a lot particularly on the weekends.  She denies chest pain.  No dizziness.  No shortness of breath.  No lower extremity edema. Outpatient Medications Prior to Visit  Medication Sig Dispense Refill  . aspirin EC 81 MG tablet Take 1 tablet (81 mg total) by mouth daily. 90 tablet 3  . atorvastatin (LIPITOR) 20 MG tablet Take 1 tablet (20 mg total) by mouth daily. Please make overdue appt with Dr. Harrington Challenger before anymore refills. 1st attempt 30 tablet 0  . cetirizine (ZYRTEC) 5 MG tablet Take 5 mg by mouth daily as needed for allergies.    Marland Kitchen ibuprofen (ADVIL,MOTRIN) 200 MG tablet Take 200 mg by mouth as needed for headache or mild pain.     No facility-administered medications prior to visit.     Allergies:   Nickel   Past Medical History:  Diagnosis Date  . Atherosclerosis   . Coronary artery disease   . Hyperlipidemia   . Ruptured appendix     Past Surgical History:  Procedure Laterality Date  . APPENDECTOMY    . colocscopy    . HERNIA REPAIR       Social History:  The patient  reports that she has quit smoking. She has never used smokeless tobacco. She reports current alcohol use. She reports that she does not use drugs.   Family History:  The patient's family history includes Colon cancer (age of onset: 63) in her brother; Emphysema in her mother; Healthy in her sister; Heart failure (age of onset: 66) in her father; Rheumatic fever (age of  onset: 37) in her father.    ROS:  Please see the history of present illness. All other systems are reviewed and  Negative to the above problem except as noted.    PHYSICAL EXAM: VS:  BP 140/68   Pulse 73   Ht 5\' 7"  (1.702 m)   Wt 185 lb (83.9 kg)   BMI 28.98 kg/m   GEN: Well nourished, well developed, in no acute distress HEENT: normal Neck: JVP is normal  No, carotid bruits,  Cardiac: RRR; no murmurs, rubs, or gallops,no edema  Respiratory:  clear to auscultation bilaterally, normal work of breathing GI: soft, nontender, nondistended, + BS  No hepatomegaly  MS: no deformity Moving all extremities   Skin: warm and dry, no rash Neuro:  Strength and sensation are intact Psych: euthymic mood, full affect   EKG:  EKG is ordered today.  SR 73 bpm     Lipid Panel    Component Value Date/Time   CHOL 138 01/21/2016 0825   TRIG 60 01/21/2016 0825   HDL 55 01/21/2016 0825   CHOLHDL 2.5 01/21/2016 0825   VLDL 12 01/21/2016 0825   LDLCALC 71 01/21/2016 0825      Wt Readings from Last 3 Encounters:  07/06/19 185 lb (83.9 kg)  04/08/18 181 lb 9.6 oz (82.4 kg)  11/23/16 193 lb (87.5 kg)      ASSESSMENT AND PLAN:  1  CAD  Mod to severe calc ofcoronary arteries   remains asymptomatic and active.  Follow  2  HL LDL was a little higher in the 80s she admits to eating cheese recommended backing off 3  Bradycardia  HR is OK  4   Blood pressure  BP is up a little now   I recomm that she get a BP cuff to follow   She has an appt with C Justin Mend next year    Goal for BP 110s to low 130s/     F/U tentatively in 1 year   Signed, Dorris Carnes, MD  07/06/2019 4:26 PM    [facility] [place], [place], [place]  [address] Phone: [phone]; Fax: [phone]

## 2019-07-06 NOTE — Patient Instructions (Signed)
Medication Instructions:  No changes *If you need a refill on your cardiac medications before your next appointment, please call your pharmacy*  Lab Work: none If you have labs (blood work) drawn today and your tests are completely normal, you will receive your results only by: Marland Kitchen MyChart Message (if you have MyChart) OR . A paper copy in the mail If you have any lab test that is abnormal or we need to change your treatment, we will call you to review the results.  Testing/Procedures: none  Follow-Up: At [facility], you and your health needs are our priority.  As part of our continuing mission to provide you with exceptional heart care, we have created designated Provider Care Teams.  These Care Teams include your primary Cardiologist (physician) and Advanced Practice Providers (APPs -  Physician Assistants and Nurse Practitioners) who all work together to provide you with the care you need, when you need it.  Your next appointment:   12 month(s)  The format for your next appointment:   In Person  Provider:   You may see Dr. Dorris Carnes or one of the following Advanced Practice Providers on your designated Care Team:    Richardson Dopp, PA-C  [place], [place]  Daune Perch, NP   Other Instructions

## 2019-08-01 ENCOUNTER — Other Ambulatory Visit: Payer: Self-pay | Admitting: Internal Medicine

## 2019-08-01 DIAGNOSIS — I2510 Atherosclerotic heart disease of native coronary artery without angina pectoris: Secondary | ICD-10-CM

## 2019-12-18 ENCOUNTER — Other Ambulatory Visit: Payer: Self-pay | Admitting: Family Medicine

## 2019-12-18 DIAGNOSIS — Z1231 Encounter for screening mammogram for malignant neoplasm of breast: Secondary | ICD-10-CM

## 2020-01-02 ENCOUNTER — Ambulatory Visit
Admission: RE | Admit: 2020-01-02 | Discharge: 2020-01-02 | Disposition: A | Discharge status: Home / Self Care | Payer: Medicare Other | Source: Ambulatory Visit

## 2020-01-02 ENCOUNTER — Other Ambulatory Visit: Payer: Self-pay | Admitting: Family Medicine

## 2020-01-02 ENCOUNTER — Other Ambulatory Visit: Payer: Self-pay

## 2020-01-02 DIAGNOSIS — E2839 Other primary ovarian failure: Secondary | ICD-10-CM

## 2020-01-02 DIAGNOSIS — Z1231 Encounter for screening mammogram for malignant neoplasm of breast: Secondary | ICD-10-CM

## 2020-05-02 ENCOUNTER — Ambulatory Visit: Payer: Medicare Other | Attending: Internal Medicine

## 2020-05-02 DIAGNOSIS — Z23 Encounter for immunization: Secondary | ICD-10-CM

## 2020-05-02 NOTE — Progress Notes (Signed)
° °  Covid-19 Vaccination Clinic  Name:  Paulla Mcclaskey    MRN: [id number] DOB: 1950/09/13  05/02/2020  Ms. Baumbach was observed post Covid-19 immunization for 15 minutes without incident. She was provided with Vaccine Information Sheet and instruction to access the V-Safe system.   Ms. Greer was instructed to call 911 with any severe reactions post vaccine:  Difficulty breathing   Swelling of face and throat   A fast heartbeat   A bad rash all over body   Dizziness and weakness

## 2020-06-07 ENCOUNTER — Ambulatory Visit
Admission: RE | Admit: 2020-06-07 | Discharge: 2020-06-07 | Disposition: A | Discharge status: Home / Self Care | Payer: Medicare Other | Source: Ambulatory Visit | Attending: Family Medicine | Admitting: Family Medicine

## 2020-06-07 ENCOUNTER — Other Ambulatory Visit: Payer: Self-pay

## 2020-06-07 DIAGNOSIS — E2839 Other primary ovarian failure: Secondary | ICD-10-CM

## 2020-07-01 ENCOUNTER — Other Ambulatory Visit: Payer: Self-pay | Admitting: Family Medicine

## 2020-07-01 DIAGNOSIS — E2839 Other primary ovarian failure: Secondary | ICD-10-CM

## 2020-07-04 NOTE — Progress Notes (Signed)
Cardiology Office Note   Date:  07/05/2020   ID:  Heather Weaver, DOB 12-31-1950, MRN [id number]  PCP:  Maurice Small, MD  Cardiologist:   Dorris Carnes, MD   F/U of coronary calcifications       History of Present Illness: Heather Weaver is a 69 y.o. female with a history of abnormal CT  CT done for F/u of  a granuloma and coincidentaly found calcifications of arteries  (mod to severe) Pt had a myovue scan in 2017 that showed norma perfusion  Echo in Junec2017 showed normal LVEF    I saw the ptin clinic  back in 2020 Outpatient Medications Prior to Visit  Medication Sig Dispense Refill  . aspirin EC 81 MG tablet Take 1 tablet (81 mg total) by mouth daily. 90 tablet 3  . atorvastatin (LIPITOR) 20 MG tablet Take 1 tablet (20 mg total) by mouth daily at 6 PM. 30 tablet 11  . Calcium Citrate-Vitamin D (CALCIUM CITRATE + D3) 250-200 MG-UNIT TABS Patient take 2 tablet  by mouth in the morning, 1 tablet by mouth at lunch and 2 tablets at bedtime    . cetirizine (ZYRTEC) 5 MG tablet Take 5 mg by mouth daily as needed for allergies.    Marland Kitchen ibuprofen (ADVIL,MOTRIN) 200 MG tablet Take 200 mg by mouth as needed for headache or mild pain.     No facility-administered medications prior to visit.     Allergies:   Nickel   Past Medical History:  Diagnosis Date  . Atherosclerosis   . Coronary artery disease   . Hyperlipidemia   . Ruptured appendix     Past Surgical History:  Procedure Laterality Date  . APPENDECTOMY    . colocscopy    . HERNIA REPAIR       Social History:  The patient  reports that she has quit smoking. She has never used smokeless tobacco. She reports current alcohol use. She reports that she does not use drugs.   Family History:  The patient's family history includes Colon cancer (age of onset: 7) in her brother; Emphysema in her mother; Healthy in her sister; Heart failure (age of onset: 11) in her father; Rheumatic fever (age of onset: 26) in her father.    ROS:   Please see the history of present illness. All other systems are reviewed and  Negative to the above problem except as noted.    PHYSICAL EXAM: VS:  BP (!) 138/50   Pulse 67   Ht 5\' 7"  (1.702 m)   Wt 185 lb (83.9 kg)   SpO2 98%   BMI 28.98 kg/m   GEN: Well nourished, well developed, in no acute distress  HEENT: normal  Neck: JVP is normal  No, carotid bruits,  Cardiac: RRR; no murmurs.  No LE edema  Respiratory:  clear to auscultation bilaterally, normal work of breathing GI: soft, nontender, nondistended, + BS  No hepatomegaly  MS: no deformity Moving all extremities   Skin: warm and dry, no rash Neuro:  Strength and sensation are intact Psych: euthymic mood, full affect   EKG:  EKG is ordered today  SR 67 bpm     Lipid Panel    Component Value Date/Time   CHOL 138 01/21/2016 0825   TRIG 60 01/21/2016 0825   HDL 55 01/21/2016 0825   CHOLHDL 2.5 01/21/2016 0825   VLDL 12 01/21/2016 0825   LDLCALC 71 01/21/2016 0825      Wt Readings from Last 3 Encounters:  07/05/20 185 lb (83.9 kg)  07/06/19 185 lb (83.9 kg)  04/08/18 181 lb 9.6 oz (82.4 kg)      ASSESSMENT AND PLAN:  1  CAD  Mod to severe calc ofcoronary arteries on CT   Pt remains active and is asymptomatic  Will continue to follow  2  HL LDL was a little higher in the 80s she admits to eating cheese recommended backing off  3  Bradycardia  HR is OK  4   Blood pressure  BP is a little elevated   Discussed diet, wt.  Encouraged her to take  Blood pressure at home  Goal 120s to low 130s    F/U tentatively in 1 year   Signed, Dorris Carnes, MD  07/05/2020 4:07 PM    [facility] [place], [place], [place]  [address] Phone: [phone]; Fax: [phone]

## 2020-07-05 ENCOUNTER — Encounter: Payer: Self-pay | Admitting: Internal Medicine

## 2020-07-05 ENCOUNTER — Ambulatory Visit (INDEPENDENT_AMBULATORY_CARE_PROVIDER_SITE_OTHER): Payer: Medicare Other | Admitting: Internal Medicine

## 2020-07-05 ENCOUNTER — Other Ambulatory Visit: Payer: Self-pay

## 2020-07-05 VITALS — BP 138/50 | HR 67 | Ht 67.0 in | Wt 185.0 lb

## 2020-07-05 DIAGNOSIS — I2510 Atherosclerotic heart disease of native coronary artery without angina pectoris: Secondary | ICD-10-CM | POA: Diagnosis not present

## 2020-07-05 NOTE — Patient Instructions (Signed)
Medication Instructions:  No changes *If you need a refill on your cardiac medications before your next appointment, please call your pharmacy*   Lab Work: none If you have labs (blood work) drawn today and your tests are completely normal, you will receive your results only by: . MyChart Message (if you have MyChart) OR . A paper copy in the mail If you have any lab test that is abnormal or we need to change your treatment, we will call you to review the results.   Testing/Procedures: none   Follow-Up: At [facility], you and your health needs are our priority.  As part of our continuing mission to provide you with exceptional heart care, we have created designated Provider Care Teams.  These Care Teams include your primary Cardiologist (physician) and Advanced Practice Providers (APPs -  Physician Assistants and Nurse Practitioners) who all work together to provide you with the care you need, when you need it.   Your next appointment:   12 month(s)  The format for your next appointment:   In Person  Provider:   You may see Paula Ross, MD or one of the following Advanced Practice Providers on your designated Care Team:    Scott Weaver, PA-C  Vin Bhagat, PA-C   Other Instructions   

## 2020-07-23 IMAGING — MG DIGITAL SCREENING BILAT W/ TOMO W/ CAD
8 series · 8 of 24 positions shown · non-contrast
Comparison: Previous exam(s).

CLINICAL DATA: Screening.

EXAM:
DIGITAL SCREENING BILATERAL MAMMOGRAM WITH TOMO AND CAD

[L CC synth-2D]
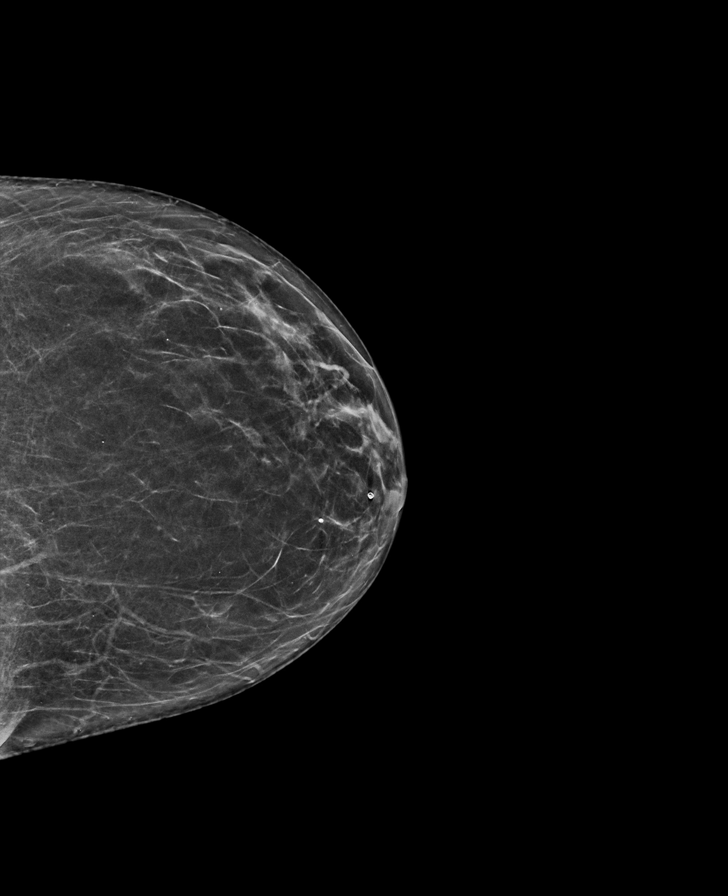

[L MLO synth-2D]
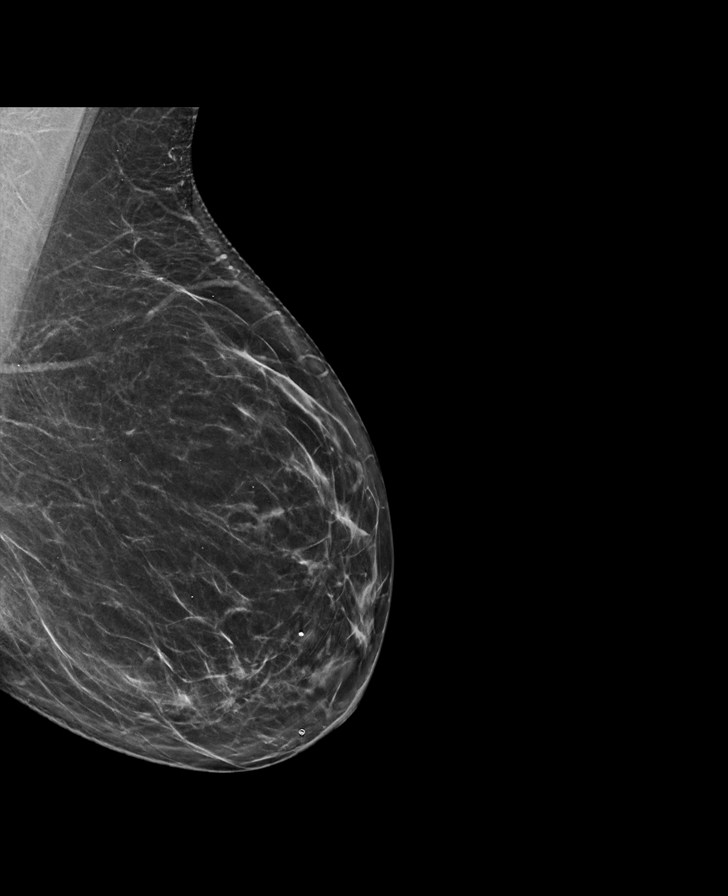

[R CC synth-2D]
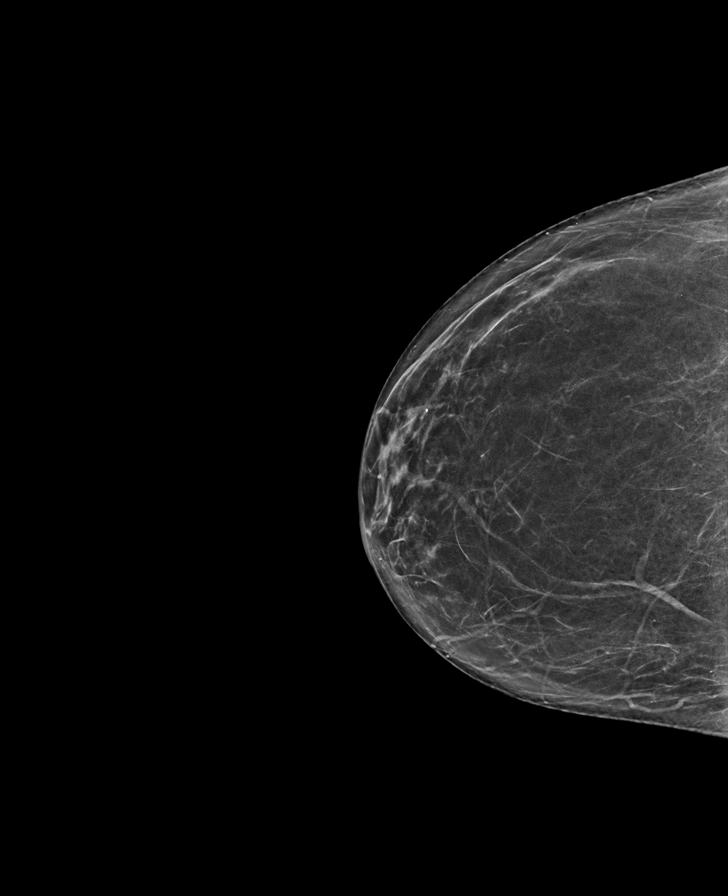

[R MLO synth-2D]
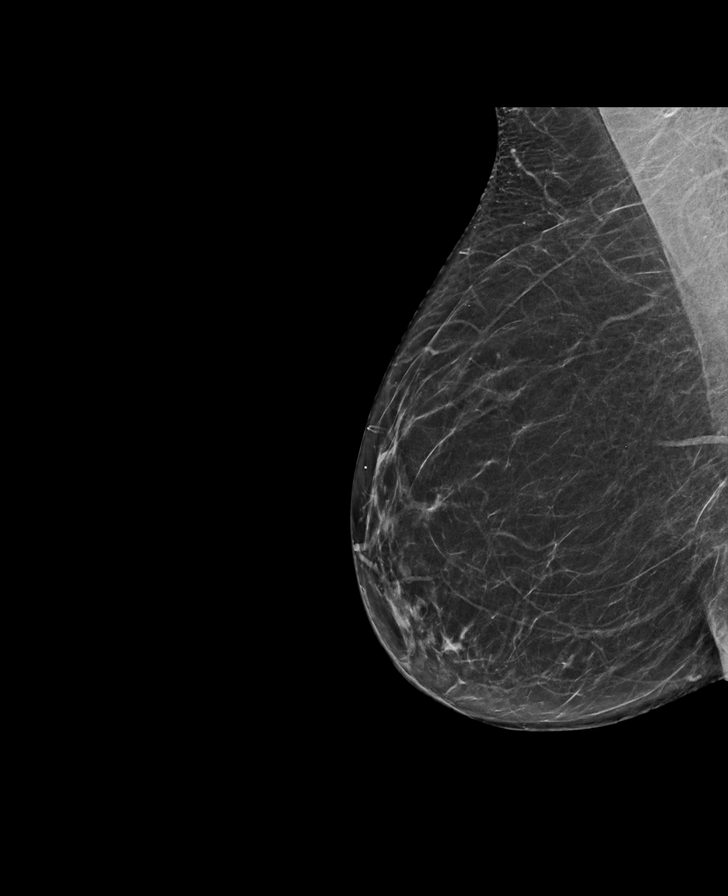

[L CC tomo · tomo slice 31/62.0]
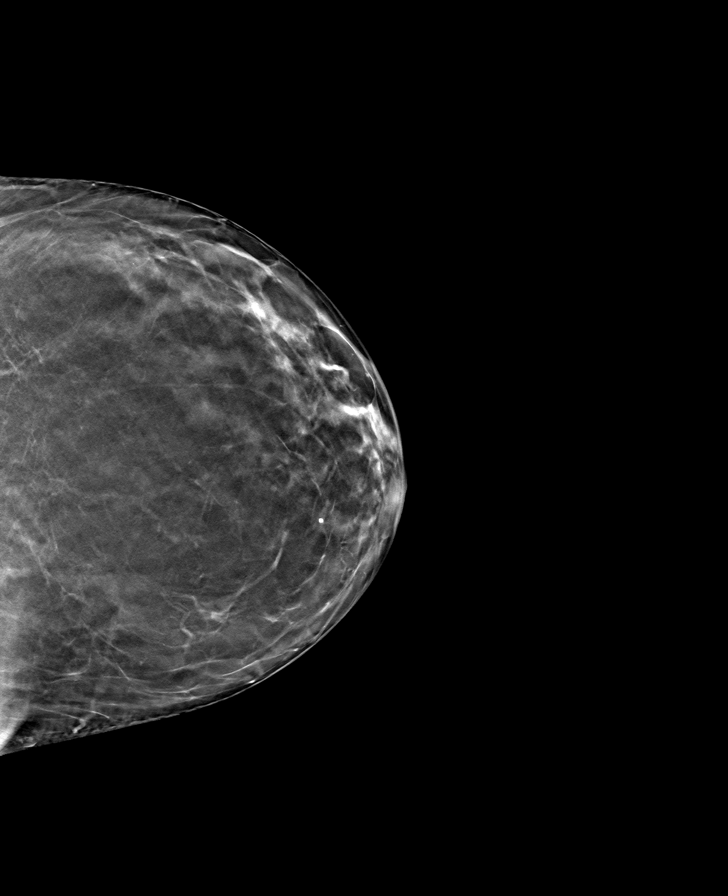

[R MLO tomo · tomo slice 34/67.0]
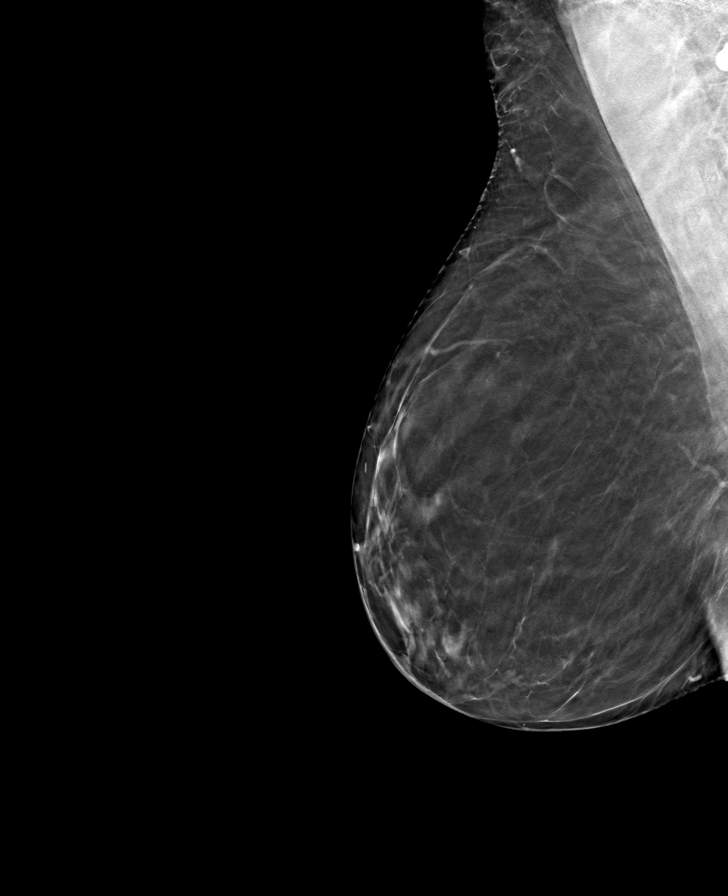

[L MLO tomo · tomo slice 35/70.0]
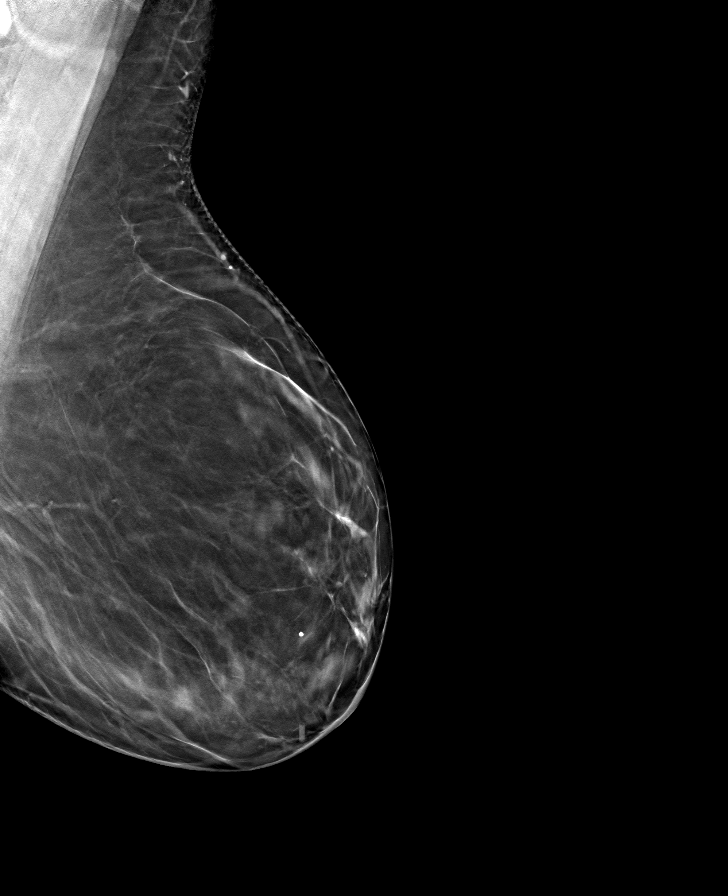

[R CC tomo · tomo slice 31/61.0]
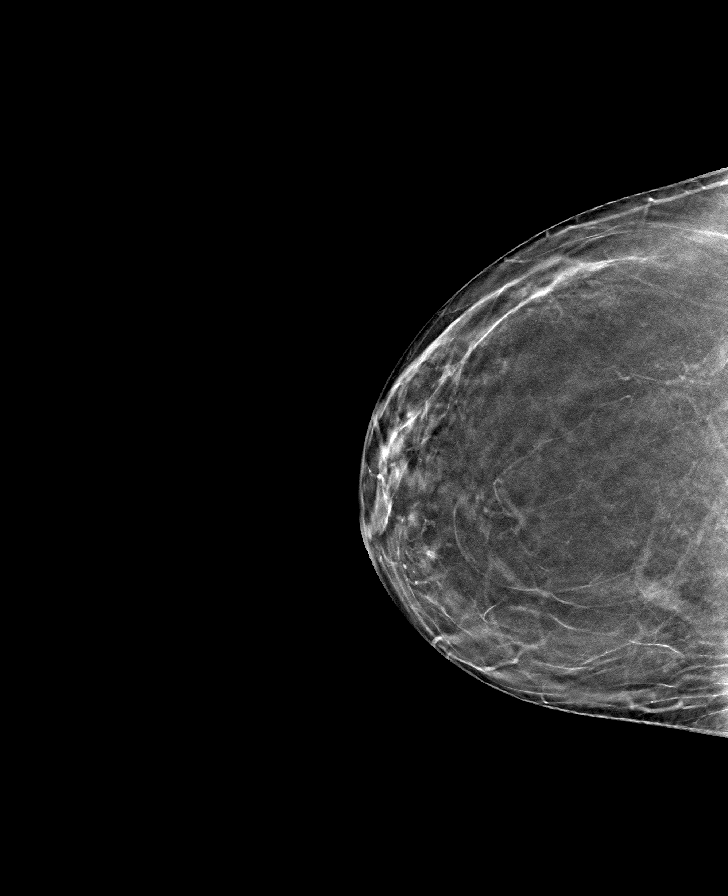

[8 of 24 positions shown; findings below may reference images not displayed]

ACR Breast Density Category b: There are scattered areas of
fibroglandular density.
FINDINGS: There are no findings suspicious for malignancy. Images were
processed with CAD.
IMPRESSION: No mammographic evidence of malignancy. A result letter of this
screening mammogram will be mailed directly to the patient.

RECOMMENDATION:
Screening mammogram in one year. (Code:[id number])

BI-RADS CATEGORY  1: Negative.

## 2020-08-01 ENCOUNTER — Other Ambulatory Visit: Payer: Self-pay

## 2020-08-01 DIAGNOSIS — I2510 Atherosclerotic heart disease of native coronary artery without angina pectoris: Secondary | ICD-10-CM

## 2020-08-01 MED ORDER — ATORVASTATIN CALCIUM 20 MG PO TABS: 20.0000 mg | ORAL_TABLET | Freq: Every day | ORAL | 3 refills | Status: DC

## 2020-12-31 ENCOUNTER — Ambulatory Visit: Payer: Medicare HMO | Attending: Internal Medicine

## 2020-12-31 ENCOUNTER — Other Ambulatory Visit: Payer: Self-pay

## 2020-12-31 ENCOUNTER — Other Ambulatory Visit (HOSPITAL_BASED_OUTPATIENT_CLINIC_OR_DEPARTMENT_OTHER): Payer: Self-pay

## 2020-12-31 DIAGNOSIS — Z23 Encounter for immunization: Secondary | ICD-10-CM

## 2020-12-31 MED ORDER — PFIZER-BIONT COVID-19 VAC-TRIS 30 MCG/0.3ML IM SUSP
INTRAMUSCULAR | 0 refills | Status: DC
  Filled 2020-12-31: qty 0.3, 1d supply, fill #0

## 2020-12-31 NOTE — Progress Notes (Signed)
   Covid-19 Vaccination Clinic  Name:  Heather Weaver    MRN: [id number] DOB: [birth year]  12/31/2020  [name] was observed post Covid-19 immunization for 15 minutes without incident. She was provided with Vaccine Information Sheet and instruction to access the V-Safe system.   [name] was instructed to call 911 with any severe reactions post vaccine: Marland Kitchen Difficulty breathing  . Swelling of face and throat  . A fast heartbeat  . A bad rash all over body  . Dizziness and weakness   Immunizations Administered    Name Date Dose VIS Date Route   PFIZER Comrnaty(Gray TOP) Covid-19 Vaccine 12/31/2020  2:47 PM 0.3 mL 07/04/2020 Intramuscular   Manufacturer: [place]   Lot: [id number]   [place]: [phone]

## 2021-01-03 DIAGNOSIS — Z5181 Encounter for therapeutic drug level monitoring: Secondary | ICD-10-CM | POA: Diagnosis not present

## 2021-01-03 DIAGNOSIS — E785 Hyperlipidemia, unspecified: Secondary | ICD-10-CM | POA: Diagnosis not present

## 2021-01-07 DIAGNOSIS — G43109 Migraine with aura, not intractable, without status migrainosus: Secondary | ICD-10-CM | POA: Diagnosis not present

## 2021-01-07 DIAGNOSIS — I7090 Unspecified atherosclerosis: Secondary | ICD-10-CM | POA: Diagnosis not present

## 2021-01-07 DIAGNOSIS — Z0000 Encounter for general adult medical examination without abnormal findings: Secondary | ICD-10-CM | POA: Diagnosis not present

## 2021-01-07 DIAGNOSIS — E785 Hyperlipidemia, unspecified: Secondary | ICD-10-CM | POA: Diagnosis not present

## 2021-04-16 ENCOUNTER — Ambulatory Visit: Payer: Medicare HMO | Attending: Internal Medicine

## 2021-04-16 ENCOUNTER — Other Ambulatory Visit (HOSPITAL_BASED_OUTPATIENT_CLINIC_OR_DEPARTMENT_OTHER): Payer: Self-pay

## 2021-04-16 DIAGNOSIS — Z23 Encounter for immunization: Secondary | ICD-10-CM

## 2021-04-16 MED ORDER — PFIZER COVID-19 VAC BIVALENT 30 MCG/0.3ML IM SUSP
INTRAMUSCULAR | 0 refills | Status: DC
  Filled 2021-04-16: qty 0.3, 1d supply, fill #0

## 2021-04-16 NOTE — Progress Notes (Signed)
   Covid-19 Vaccination Clinic  Name:  Heather Weaver    MRN: [id number] DOB: 01-Mar-1951  04/16/2021  Ms. Heather Weaver was observed post Covid-19 immunization for 15 minutes without incident. She was provided with Vaccine Information Sheet and instruction to access the V-Safe system.   Ms. Heather Weaver was instructed to call 911 with any severe reactions post vaccine: Difficulty breathing  Swelling of face and throat  A fast heartbeat  A bad rash all over body  Dizziness and weakness

## 2021-05-12 DIAGNOSIS — Z23 Encounter for immunization: Secondary | ICD-10-CM | POA: Diagnosis not present

## 2021-07-29 NOTE — Progress Notes (Signed)
Cardiology Office Note   Date:  07/31/2021   ID:  Heather Weaver, DOB May 14, 1951, MRN [id number]  PCP:  Maurice Small, MD  Cardiologist:   Dorris Carnes, MD   F/U of CAD        History of Present Illness: Heather Weaver is a 71 y.o. female with a history of coronary calcificatsion on CT   Myoview scan in 2017 showed normal perfusion   LVEF by echo in 2017 was normal    I saw the pt in clinic in Dec 2021 Since seen the pt says she has done [facility] several times per week Denies SOB  No CP   NO dizziness   No palpitations    Outpatient Medications Prior to Visit  Medication Sig Dispense Refill   aspirin EC 81 MG tablet Take 1 tablet (81 mg total) by mouth daily. 90 tablet 3   atorvastatin (LIPITOR) 20 MG tablet Take 1 tablet (20 mg total) by mouth daily at 6 PM. 90 tablet 3   CALCIUM-VITAMIN D PO Take 600 mg by mouth in the morning and at bedtime.     cetirizine (ZYRTEC) 5 MG tablet Take 5 mg by mouth daily as needed for allergies.     ibuprofen (ADVIL,MOTRIN) 200 MG tablet Take 200 mg by mouth as needed for headache or mild pain (as directed).     Calcium Citrate-Vitamin D (CALCIUM CITRATE + D3) 250-200 MG-UNIT TABS Patient take 2 tablet  by mouth in the morning, 1 tablet by mouth at lunch and 2 tablets at bedtime (Patient not taking: Reported on 07/31/2021)     COVID-19 mRNA bivalent vaccine, Pfizer, (PFIZER COVID-19 VAC BIVALENT) injection Inject into the muscle. (Patient not taking: Reported on 07/31/2021) 0.3 mL 0   COVID-19 mRNA Vac-TriS, Pfizer, (PFIZER-BIONT COVID-19 VAC-TRIS) SUSP injection Inject into the muscle. (Patient not taking: Reported on 07/31/2021) 0.3 mL 0   No facility-administered medications prior to visit.     Allergies:   Nickel   Past Medical History:  Diagnosis Date   Atherosclerosis    Coronary artery disease    Hyperlipidemia    Ruptured appendix     Past Surgical History:  Procedure Laterality Date   APPENDECTOMY     colocscopy     HERNIA REPAIR        Social History:  The patient  reports that she has quit smoking. She has never used smokeless tobacco. She reports current alcohol use. She reports that she does not use drugs.   Family History:  The patient's family history includes Colon cancer (age of onset: 34) in her brother; Emphysema in her mother; Healthy in her sister; Heart failure (age of onset: 89) in her father; Rheumatic fever (age of onset: 40) in her father.    ROS:  Please see the history of present illness. All other systems are reviewed and  Negative to the above problem except as noted.    PHYSICAL EXAM: VS:  BP 128/72    Pulse 66    Ht 5\' 7"  (1.702 m)    Wt 184 lb (83.5 kg)    SpO2 97%    BMI 28.82 kg/m   GEN: Well nourished, well developed, in no acute distress  HEENT: normal  Neck: JVP is normal  No, carotid bruits,  Cardiac: RRR; no murmurs.  No LE edema  Respiratory:  clear to auscultation bilaterally GI: soft, nontender, nondistended, + BS  No hepatomegaly  MS: no deformity Moving all extremities  Skin: warm and dry, no rash Neuro:  Strength and sensation are intact Psych: euthymic mood, full affect   EKG:  EKG is ordered today  SR 66 bpm     Lipid Panel    Component Value Date/Time   CHOL 138 01/21/2016 0825   TRIG 60 01/21/2016 0825   HDL 55 01/21/2016 0825   CHOLHDL 2.5 01/21/2016 0825   VLDL 12 01/21/2016 0825   LDLCALC 71 01/21/2016 0825      Wt Readings from Last 3 Encounters:  07/31/21 184 lb (83.5 kg)  07/05/20 185 lb (83.9 kg)  07/06/19 185 lb (83.9 kg)      ASSESSMENT AND PLAN:  1  CAD  Mod to severe calcifications of coronary arteries on CT  Normal myview   Pt still asymptomatic   Active   Will continue to follow  2  HL LDL is excellent in June   68  COntinue    3  Bradycardia  HR is OK  4   Blood pressure BP is good       F/U next winter  Signed, Dorris Carnes, MD  07/31/2021 9:42 PM    [facility] [place], [place], [place]   [address] Phone: [phone]; Fax: [phone]

## 2021-07-31 ENCOUNTER — Ambulatory Visit: Payer: Medicare HMO | Admitting: Internal Medicine

## 2021-07-31 ENCOUNTER — Encounter: Payer: Self-pay | Admitting: Internal Medicine

## 2021-07-31 ENCOUNTER — Other Ambulatory Visit: Payer: Self-pay

## 2021-07-31 VITALS — BP 128/72 | HR 66 | Ht 67.0 in | Wt 184.0 lb

## 2021-07-31 DIAGNOSIS — I2510 Atherosclerotic heart disease of native coronary artery without angina pectoris: Secondary | ICD-10-CM

## 2021-07-31 NOTE — Patient Instructions (Signed)
Medication Instructions:  Your physician recommends that you continue on your current medications as directed. Please refer to the Current Medication list given to you today.  *If you need a refill on your cardiac medications before your next appointment, please call your pharmacy*   Lab Work: none If you have labs (blood work) drawn today and your tests are completely normal, you will receive your results only by: [place] (if you have MyChart) OR A paper copy in the mail If you have any lab test that is abnormal or we need to change your treatment, we will call you to review the results.   Testing/Procedures: none   Follow-Up: At [facility], you and your health needs are our priority.  As part of our continuing mission to provide you with exceptional heart care, we have created designated Provider Care Teams.  These Care Teams include your primary Cardiologist (physician) and Advanced Practice Providers (APPs -  Physician Assistants and Nurse Practitioners) who all work together to provide you with the care you need, when you need it.  We recommend signing up for the patient portal called "MyChart".  Sign up information is provided on this After Visit Summary.  MyChart is used to connect with patients for Virtual Visits (Telemedicine).  Patients are able to view lab/test results, encounter notes, upcoming appointments, etc.  Non-urgent messages can be sent to your provider as well.   To learn more about what you can do with MyChart, go to [email or web address].    Your next appointment:   1 year(s) Dec 2023  The format for your next appointment:   In Person  Provider:   Dr. Dorris Carnes   Other Instructions

## 2021-08-14 DIAGNOSIS — H02412 Mechanical ptosis of left eyelid: Secondary | ICD-10-CM | POA: Diagnosis not present

## 2021-08-14 DIAGNOSIS — D485 Neoplasm of uncertain behavior of skin: Secondary | ICD-10-CM | POA: Diagnosis not present

## 2021-08-14 DIAGNOSIS — H57813 Brow ptosis, bilateral: Secondary | ICD-10-CM | POA: Diagnosis not present

## 2021-08-29 ENCOUNTER — Other Ambulatory Visit: Payer: Self-pay

## 2021-08-29 DIAGNOSIS — I2510 Atherosclerotic heart disease of native coronary artery without angina pectoris: Secondary | ICD-10-CM

## 2021-08-29 MED ORDER — ATORVASTATIN CALCIUM 20 MG PO TABS: 20.0000 mg | ORAL_TABLET | Freq: Every day | ORAL | 3 refills | Status: DC

## 2021-08-29 NOTE — Telephone Encounter (Signed)
Pt's medication was sent to pt's pharmacy as requested. Confirmation received.  °

## 2022-01-28 ENCOUNTER — Encounter (HOSPITAL_COMMUNITY): Payer: Self-pay | Admitting: Emergency Medicine

## 2022-01-28 ENCOUNTER — Other Ambulatory Visit: Payer: Self-pay

## 2022-01-28 ENCOUNTER — Ambulatory Visit (HOSPITAL_COMMUNITY)
Admission: EM | Admit: 2022-01-28 | Discharge: 2022-01-28 | Disposition: A | Discharge status: Home / Self Care | Payer: Medicare HMO | Attending: Family Medicine | Admitting: Family Medicine

## 2022-01-28 ENCOUNTER — Ambulatory Visit (INDEPENDENT_AMBULATORY_CARE_PROVIDER_SITE_OTHER): Payer: Medicare HMO

## 2022-01-28 DIAGNOSIS — M21241 Flexion deformity, right finger joints: Secondary | ICD-10-CM

## 2022-01-28 DIAGNOSIS — M79644 Pain in right finger(s): Secondary | ICD-10-CM

## 2022-01-28 NOTE — ED Triage Notes (Signed)
Right ring finger issue first noted 2 weeks ago.  Denies any injury, no bruising, no pain.  Slight redness at knuckle.  Finger is bending at knuckle.  Patient has searched Internet and concerned for "trigger finger"

## 2022-01-28 NOTE — ED Provider Notes (Signed)
[facility]    CSN: [id number] Arrival date & time: 01/28/22  0941      History   Chief Complaint Chief Complaint  Patient presents with   Hand Problem    HPI Heather Weaver is a 71 y.o. female.   HPI Here for a 2-1/2-week history of inability to extend her DIP joint on her right ring finger.  Is not really painful there.  No trauma or injury.  She does have a little bit of pink swelling on the dorsal surface of that joint.  No fever or rash  Past Medical History:  Diagnosis Date   Atherosclerosis    Coronary artery disease    Hyperlipidemia    Ruptured appendix     There are no problems to display for this patient.   Past Surgical History:  Procedure Laterality Date   APPENDECTOMY     colocscopy     HERNIA REPAIR      OB History   No obstetric history on file.      Home Medications    Prior to Admission medications   Medication Sig Start Date End Date Taking? Authorizing Provider  aspirin EC 81 MG tablet Take 1 tablet (81 mg total) by mouth daily. 11/25/15   Fay Records, MD  atorvastatin (LIPITOR) 20 MG tablet Take 1 tablet (20 mg total) by mouth daily at 6 PM. 08/29/21   Fay Records, MD  Calcium Citrate-Vitamin D (CALCIUM CITRATE + D3) 250-200 MG-UNIT TABS Patient take 2 tablet  by mouth in the morning, 1 tablet by mouth at lunch and 2 tablets at bedtime Patient not taking: Reported on 01/28/2022 06/10/20   [provider]  CALCIUM-VITAMIN D PO Take 600 mg by mouth in the morning and at bedtime.    [provider]  cetirizine (ZYRTEC) 5 MG tablet Take 5 mg by mouth daily as needed for allergies.    [provider]  COVID-19 mRNA bivalent vaccine, Pfizer, (PFIZER COVID-19 VAC BIVALENT) injection Inject into the muscle. Patient not taking: Reported on 07/31/2021 04/16/21   Carlyle Basques, MD  COVID-19 mRNA Vac-TriS, Pfizer, (PFIZER-BIONT COVID-19 VAC-TRIS) SUSP injection Inject into the muscle. Patient not taking: Reported on  07/31/2021 12/31/20   Carlyle Basques, MD  ibuprofen (ADVIL,MOTRIN) 200 MG tablet Take 200 mg by mouth as needed for headache or mild pain (as directed).    [provider]    Family History Family History  Problem Relation Age of Onset   Emphysema Mother    Heart failure Father 57       do to rheumastic fever   Rheumatic fever Father 20   Colon cancer Brother 60   Healthy Sister        age 33    Social History Social History   Tobacco Use   Smoking status: Former   Smokeless tobacco: Never  Scientific laboratory technician Use: Never used  Substance Use Topics   Alcohol use: Yes    Alcohol/week: 0.0 standard drinks of alcohol   Drug use: Yes    Types: Marijuana     Allergies   Nickel   Review of Systems Review of Systems   Physical Exam Triage Vital Signs ED Triage Vitals  Enc Vitals Group     BP 01/28/22 1030 (!) 155/78     Pulse Rate 01/28/22 1030 64     Resp 01/28/22 1030 20     Temp 01/28/22 1030 98.2 F (36.8 C)  Temp Source 01/28/22 1030 Oral     SpO2 01/28/22 1030 96 %     Weight --      Height --      Head Circumference --      Peak Flow --      Pain Score 01/28/22 1027 0     Pain Loc --      Pain Edu? --      Excl. in [place]? --    No data found.  Updated Vital Signs BP (!) 155/78 (BP Location: Left Arm)   Pulse 64   Temp 98.2 F (36.8 C) (Oral)   Resp 20   SpO2 96%   Visual Acuity Right Eye Distance:   Left Eye Distance:   Bilateral Distance:    Right Eye Near:   Left Eye Near:    Bilateral Near:     Physical Exam Vitals reviewed.  Constitutional:      General: She is not in acute distress.    Appearance: She is not ill-appearing, toxic-appearing or diaphoretic.  Musculoskeletal:     Comments: On passive range of motion, I can fully extend her ring finger at the DIP joint.  She is unable to.  There is some pink mild swelling on the dorsal surface of the DIP joint of the ring finger.  It is more consistent with Heberden's nodes and  any kind of cellulitis.  Skin:    Coloration: Skin is not jaundiced or pale.  Neurological:     General: No focal deficit present.     Mental Status: She is alert and oriented to person, place, and time.  Psychiatric:        Behavior: Behavior normal.      UC Treatments / Results  Labs (all labs ordered are listed, but only abnormal results are displayed) Labs Reviewed - No data to display  EKG   Radiology DG Finger Ring Right  Result Date: 01/28/2022 CLINICAL DATA:  Unable to extend right ring finger and the IP joint EXAM: RIGHT RING FINGER 2+V COMPARISON:  None Available. FINDINGS: There is no evidence of fracture or dislocation. There is no evidence of arthropathy or other focal bone abnormality. Soft tissues are unremarkable. IMPRESSION: Negative. Electronically Signed   By: Macy Mis M.D.   On: 01/28/2022 11:38    Procedures Procedures (including critical care time)  Medications Ordered in UC Medications - No data to display  Initial Impression / Assessment and Plan / UC Course  I have reviewed the triage vital signs and the nursing notes.  Pertinent labs & imaging results that were available during my care of the patient were reviewed by me and considered in my medical decision making (see chart for details).     X-ray does not show any bony abnormality Final Clinical Impressions(s) / UC Diagnoses   Final diagnoses:  Flexion deformity of finger joint of right hand     Discharge Instructions      Your x-ray did not show any fracture or bony cyst.  Use the website or QR code at the end of your paperwork to schedule a new patient appointment for primary care         ED Prescriptions   None    PDMP not reviewed this encounter.   Barrett Henle, MD 01/28/22 1147

## 2022-01-28 NOTE — Discharge Instructions (Addendum)
Your x-ray did not show any fracture or bony cyst.  Use the website or QR code at the end of your paperwork to schedule a new patient appointment for primary care

## 2022-02-25 DIAGNOSIS — M20011 Mallet finger of right finger(s): Secondary | ICD-10-CM | POA: Diagnosis not present

## 2022-02-25 DIAGNOSIS — M25641 Stiffness of right hand, not elsewhere classified: Secondary | ICD-10-CM | POA: Diagnosis not present

## 2022-04-07 DIAGNOSIS — Z6829 Body mass index (BMI) 29.0-29.9, adult: Secondary | ICD-10-CM | POA: Diagnosis not present

## 2022-04-07 DIAGNOSIS — Z20822 Contact with and (suspected) exposure to covid-19: Secondary | ICD-10-CM | POA: Diagnosis not present

## 2022-04-07 DIAGNOSIS — R030 Elevated blood-pressure reading, without diagnosis of hypertension: Secondary | ICD-10-CM | POA: Diagnosis not present

## 2022-04-07 DIAGNOSIS — Z03818 Encounter for observation for suspected exposure to other biological agents ruled out: Secondary | ICD-10-CM | POA: Diagnosis not present

## 2022-04-13 DIAGNOSIS — M20011 Mallet finger of right finger(s): Secondary | ICD-10-CM | POA: Diagnosis not present

## 2022-04-14 DIAGNOSIS — M25641 Stiffness of right hand, not elsewhere classified: Secondary | ICD-10-CM | POA: Diagnosis not present

## 2022-04-21 DIAGNOSIS — M25641 Stiffness of right hand, not elsewhere classified: Secondary | ICD-10-CM | POA: Diagnosis not present

## 2022-05-04 DIAGNOSIS — Z23 Encounter for immunization: Secondary | ICD-10-CM | POA: Diagnosis not present

## 2022-05-25 DIAGNOSIS — M20011 Mallet finger of right finger(s): Secondary | ICD-10-CM | POA: Diagnosis not present

## 2022-06-30 ENCOUNTER — Telehealth: Payer: Self-pay | Admitting: Internal Medicine

## 2022-06-30 DIAGNOSIS — I2510 Atherosclerotic heart disease of native coronary artery without angina pectoris: Secondary | ICD-10-CM

## 2022-06-30 NOTE — Telephone Encounter (Signed)
Last note:   ASSESSMENT AND PLAN:   1  CAD  Mod to severe calcifications of coronary arteries on CT  Normal myview   Pt still asymptomatic   Active   Will continue to follow   2  HL LDL is excellent in June   68  COntinue     3  Bradycardia  HR is OK   4   Blood pressure BP is good        Next OV 07/31/2022... asking if she can have labs prior.

## 2022-06-30 NOTE — Telephone Encounter (Signed)
Pt would like a callback if lab work is needed to be done before her annual visit on 07/31/22. Please advise

## 2022-07-01 NOTE — Telephone Encounter (Signed)
CBC, BMET, lipomed, Lpa and Hgb A1C

## 2022-07-02 NOTE — Telephone Encounter (Signed)
My chart sent to the pt and need lab date.

## 2022-07-10 ENCOUNTER — Ambulatory Visit: Payer: Medicare HMO | Attending: Internal Medicine

## 2022-07-10 DIAGNOSIS — E785 Hyperlipidemia, unspecified: Secondary | ICD-10-CM | POA: Diagnosis not present

## 2022-07-10 DIAGNOSIS — I2510 Atherosclerotic heart disease of native coronary artery without angina pectoris: Secondary | ICD-10-CM | POA: Diagnosis not present

## 2022-07-10 LAB — CBC

## 2022-07-12 LAB — BASIC METABOLIC PANEL
BUN/Creatinine Ratio: 26 (ref 12–28)
BUN: 19 mg/dL (ref 8–27)
CO2: 23 mmol/L (ref 20–29)
Calcium: 9.6 mg/dL (ref 8.7–10.3)
Chloride: 105 mmol/L (ref 96–106)
Creatinine, Ser: 0.74 mg/dL (ref 0.57–1.00)
Glucose: 96 mg/dL (ref 70–99)
Potassium: 4.3 mmol/L (ref 3.5–5.2)
Sodium: 141 mmol/L (ref 134–144)
eGFR: 86 mL/min/{1.73_m2} (ref >59)

## 2022-07-12 LAB — LIPOPROTEIN A (LPA): Lipoprotein (a): 10.9 nmol/L (ref <75.0)

## 2022-07-12 LAB — HEMOGLOBIN A1C
Est. average glucose Bld gHb Est-mCnc: 111 mg/dL
Hgb A1c MFr Bld: 5.5 % (ref 4.8–5.6)

## 2022-07-12 LAB — CBC
Hematocrit: 39.6 % (ref 34.0–46.6)
Hemoglobin: 13 g/dL (ref 11.1–15.9)
MCH: 28.2 pg (ref 26.6–33.0)
MCHC: 32.8 g/dL (ref 31.5–35.7)
MCV: 86 fL (ref 79–97)
Platelets: 230 10*3/uL (ref 150–450)
RBC: 4.61 x10E6/uL (ref 3.77–5.28)
RDW: 12.7 % (ref 11.7–15.4)
WBC: 5.7 10*3/uL (ref 3.4–10.8)

## 2022-07-12 LAB — NMR, LIPOPROFILE
Cholesterol, Total: 162 mg/dL (ref 100–199)
HDL Particle Number: 40.8 umol/L (ref >=30.5)
HDL-C: 68 mg/dL (ref >39)
LDL Particle Number: 1057 nmol/L — ABNORMAL HIGH (ref <1000)
LDL Size: 21.1 nm (ref >20.5)
LDL-C (NIH Calc): 81 mg/dL (ref 0–99)
LP-IR Score: 31 (ref <=45)
Small LDL Particle Number: 512 nmol/L (ref <=527)
Triglycerides: 64 mg/dL (ref 0–149)

## 2022-07-14 ENCOUNTER — Telehealth: Payer: Self-pay

## 2022-07-14 DIAGNOSIS — I2510 Atherosclerotic heart disease of native coronary artery without angina pectoris: Secondary | ICD-10-CM

## 2022-07-14 MED ORDER — ATORVASTATIN CALCIUM 40 MG PO TABS: 40.0000 mg | ORAL_TABLET | Freq: Every day | ORAL | 3 refills | Status: DC

## 2022-07-14 NOTE — Telephone Encounter (Signed)
-----   Message from Fay Records, MD sent at 07/13/2022  4:50 PM EST ----- LDL is 81 with particle number over 1000.  Given plaquing on artieries goal is 70 or less  I would increase lipitor to 40 mg in evening   Check lipomed in 8 wks   check Hgb A1C at that time  CBC normal Electrolytes and kidney function are normal     A1C 5.5   Will follow   LImit carbs

## 2022-07-14 NOTE — Telephone Encounter (Signed)
My Chart sent to the pt.  Left a message for her to call back.   Orders placed.

## 2022-07-15 ENCOUNTER — Encounter: Payer: Self-pay | Admitting: Internal Medicine

## 2022-07-16 MED ORDER — ATORVASTATIN CALCIUM 40 MG PO TABS: 40.0000 mg | ORAL_TABLET | Freq: Every day | ORAL | 3 refills | Status: DC

## 2022-07-31 ENCOUNTER — Encounter: Payer: Self-pay | Admitting: Internal Medicine

## 2022-07-31 ENCOUNTER — Other Ambulatory Visit: Payer: Self-pay

## 2022-07-31 ENCOUNTER — Ambulatory Visit: Payer: Medicare HMO | Attending: Internal Medicine | Admitting: Internal Medicine

## 2022-07-31 VITALS — BP 144/82 | HR 82 | Ht 66.5 in | Wt 183.6 lb

## 2022-07-31 DIAGNOSIS — I2510 Atherosclerotic heart disease of native coronary artery without angina pectoris: Secondary | ICD-10-CM

## 2022-07-31 MED ORDER — ATORVASTATIN CALCIUM 40 MG PO TABS: 40.0000 mg | ORAL_TABLET | Freq: Every day | ORAL | 3 refills | Status: DC

## 2022-07-31 NOTE — Patient Instructions (Signed)
Medication Instructions:   *If you need a refill on your cardiac medications before your next appointment, please call your pharmacy*   Lab Work:  If you have labs (blood work) drawn today and your tests are completely normal, you will receive your results only by: [place] (if you have MyChart) OR A paper copy in the mail If you have any lab test that is abnormal or we need to change your treatment, we will call you to review the results.   Testing/Procedures:    Follow-Up: At [facility], you and your health needs are our priority.  As part of our continuing mission to provide you with exceptional heart care, we have created designated Provider Care Teams.  These Care Teams include your primary Cardiologist (physician) and Advanced Practice Providers (APPs -  Physician Assistants and Nurse Practitioners) who all work together to provide you with the care you need, when you need it.  We recommend signing up for the patient portal called "MyChart".  Sign up information is provided on this After Visit Summary.  MyChart is used to connect with patients for Virtual Visits (Telemedicine).  Patients are able to view lab/test results, encounter notes, upcoming appointments, etc.  Non-urgent messages can be sent to your provider as well.   To learn more about what you can do with MyChart, go to [email or web address].    Your next appointment:   1 year(s)  The format for your next appointment:   In Person  Provider:   DR Dorris Carnes    Other Instructions   Important Information About Sugar

## 2022-07-31 NOTE — Progress Notes (Signed)
Cardiology Office Note   Date:  07/31/2022   ID:  Heather Weaver, DOB [birth year], MRN [id number]  PCP:  Pcp, No  Cardiologist:   Dorris Carnes, MD   F/U of CAD        History of Present Illness: Heather Weaver is a 72 y.o. female with a history of coronary calcificatsion on CT   Myoview scan in 2017 showed normal perfusion   LVEF by echo in 2017 was normal    I saw the pt in clinic one year ago   Since then she hs done well from cardiac [place]  Breahting OK   No CP  No dizzeness   No palitatitons   Pt recovered from COVID  Tested negative last week   Outpatient Medications Prior to Visit  Medication Sig Dispense Refill   aspirin EC 81 MG tablet Take 1 tablet (81 mg total) by mouth daily. 90 tablet 3   atorvastatin (LIPITOR) 40 MG tablet Take 1 tablet (40 mg total) by mouth daily. At bedtime 90 tablet 3   CALCIUM-VITAMIN D PO Take 600 mg by mouth in the morning and at bedtime.     cetirizine (ZYRTEC) 5 MG tablet Take 5 mg by mouth daily as needed for allergies.     ibuprofen (ADVIL,MOTRIN) 200 MG tablet Take 200 mg by mouth as needed for headache or mild pain (as directed).     Calcium Citrate-Vitamin D (CALCIUM CITRATE + D3) 250-200 MG-UNIT TABS Patient take 2 tablet  by mouth in the morning, 1 tablet by mouth at lunch and 2 tablets at bedtime (Patient not taking: Reported on 01/28/2022)     COVID-19 mRNA bivalent vaccine, Pfizer, (PFIZER COVID-19 VAC BIVALENT) injection Inject into the muscle. (Patient not taking: Reported on 07/31/2021) 0.3 mL 0   COVID-19 mRNA Vac-TriS, Pfizer, (PFIZER-BIONT COVID-19 VAC-TRIS) SUSP injection Inject into the muscle. (Patient not taking: Reported on 07/31/2021) 0.3 mL 0   No facility-administered medications prior to visit.     Allergies:   Nickel   Past Medical History:  Diagnosis Date   Atherosclerosis    Coronary artery disease    Hyperlipidemia    Ruptured appendix     Past Surgical History:  Procedure Laterality Date   APPENDECTOMY      colocscopy     HERNIA REPAIR       Social History:  The patient  reports that she has quit smoking. She has never used smokeless tobacco. She reports current alcohol use. She reports current drug use. Drug: Marijuana.   Family History:  The patient's family history includes Colon cancer (age of onset: 50) in her brother; Emphysema in her mother; Healthy in her sister; Heart failure (age of onset: 78) in her father; Rheumatic fever (age of onset: 44) in her father.    ROS:  Please see the history of present illness. All other systems are reviewed and  Negative to the above problem except as noted.    PHYSICAL EXAM: VS:  BP (!) 144/82   Pulse 82   Ht 5' 6.5" (1.689 m)   Wt 183 lb 9.6 oz (83.3 kg)   SpO2 96%   BMI 29.19 kg/m   GEN: Well nourished, well developed, in no acute distress  HEENT: normal  Neck: JVP is normal  No, carotid bruits,  Cardiac: RRR; no murmurs.  No LE edema  Respiratory:  clear to auscultation bilaterally GI: soft, nontender, nondistended, + BS  No hepatomegaly  MS: no deformity Moving all extremities  Skin: warm and dry, no rash Neuro:  Strength and sensation are intact Psych: euthymic mood, full affect   EKG:  EKG is ordered today  SR 82 bpm  Nonspecific ST changes    Lipid Panel    Component Value Date/Time   CHOL 138 01/21/2016 0825   TRIG 60 01/21/2016 0825   HDL 55 01/21/2016 0825   CHOLHDL 2.5 01/21/2016 0825   VLDL 12 01/21/2016 0825   LDLCALC 71 01/21/2016 0825      Wt Readings from Last 3 Encounters:  07/31/22 183 lb 9.6 oz (83.3 kg)  07/31/21 184 lb (83.5 kg)  07/05/20 185 lb (83.9 kg)      ASSESSMENT AND PLAN:  1  CAD  Mod to severe calcifications of coronary arteries on CT  Normal myview   Pt still asymptomatic   Active   Will continue to follow  2  HL LDL was 81 in Dec   Recomm increasing  lipitor to 40  Get labs this winter     3  Bradycardia  HR is good   4   Blood pressure BP is a little high  She would like to try  diet    Follow   Write back if not improved   Goal 110s to 120s to low 130s      F/U next winter  Signed, Dorris Carnes, MD  07/31/2022 4:43 PM    [address], [place], [place]  [address] Phone: [phone]; Fax: [phone]

## 2022-09-11 ENCOUNTER — Ambulatory Visit: Payer: Medicare HMO | Attending: Internal Medicine

## 2022-09-11 DIAGNOSIS — Z720 Tobacco use: Secondary | ICD-10-CM | POA: Diagnosis not present

## 2022-09-11 DIAGNOSIS — I2510 Atherosclerotic heart disease of native coronary artery without angina pectoris: Secondary | ICD-10-CM | POA: Diagnosis not present

## 2022-09-11 DIAGNOSIS — M1712 Unilateral primary osteoarthritis, left knee: Secondary | ICD-10-CM | POA: Diagnosis not present

## 2022-09-12 LAB — NMR, LIPOPROFILE
Cholesterol, Total: 143 mg/dL (ref 100–199)
HDL Particle Number: 40.6 umol/L (ref >=30.5)
HDL-C: 69 mg/dL (ref >39)
LDL Particle Number: 759 nmol/L (ref <1000)
LDL Size: 20.6 nm (ref >20.5)
LDL-C (NIH Calc): 61 mg/dL (ref 0–99)
LP-IR Score: <25 (ref <=45)
Small LDL Particle Number: 381 nmol/L (ref <=527)
Triglycerides: 63 mg/dL (ref 0–149)

## 2022-09-12 LAB — HEMOGLOBIN A1C
Est. average glucose Bld gHb Est-mCnc: 111 mg/dL
Hgb A1c MFr Bld: 5.5 % (ref 4.8–5.6)

## 2023-06-17 ENCOUNTER — Other Ambulatory Visit (HOSPITAL_BASED_OUTPATIENT_CLINIC_OR_DEPARTMENT_OTHER): Payer: Self-pay

## 2023-06-17 MED ORDER — INFLUENZA VAC A&B SURF ANT ADJ 0.5 ML IM SUSY
0.5000 mL | PREFILLED_SYRINGE | Freq: Once | INTRAMUSCULAR | 0 refills | Status: AC
  Filled 2023-06-17: qty 0.5, 1d supply, fill #0

## 2023-06-25 ENCOUNTER — Other Ambulatory Visit (HOSPITAL_BASED_OUTPATIENT_CLINIC_OR_DEPARTMENT_OTHER): Payer: Self-pay

## 2023-06-25 MED ORDER — COVID-19 MRNA VAC-TRIS(PFIZER) 30 MCG/0.3ML IM SUSY
0.3000 mL | PREFILLED_SYRINGE | Freq: Once | INTRAMUSCULAR | 0 refills | Status: AC
  Filled 2023-06-25: qty 0.3, 1d supply, fill #0

## 2023-08-19 ENCOUNTER — Telehealth: Payer: Self-pay | Admitting: Internal Medicine

## 2023-08-19 DIAGNOSIS — I2510 Atherosclerotic heart disease of native coronary artery without angina pectoris: Secondary | ICD-10-CM

## 2023-08-19 MED ORDER — ATORVASTATIN CALCIUM 40 MG PO TABS: 40.0000 mg | ORAL_TABLET | Freq: Every day | ORAL | 0 refills | Status: DC

## 2023-08-19 NOTE — Telephone Encounter (Signed)
Will sent to Dr Tenny Craw for her review and any orders.

## 2023-08-19 NOTE — Telephone Encounter (Signed)
 *  STAT* If patient is at the pharmacy, call can be transferred to refill team.   1. Which medications need to be refilled? (please list name of each medication and dose if known)   atorvastatin (LIPITOR) 40 MG tablet   2. Which pharmacy/location (including street and city if local pharmacy) is medication to be sent to?  [facility] - [place], [place] - [address] [facility] AT Portal to Registered [facility] Sites   3. Do they need a 30 day or 90 day supply?  90  Patient has upcoming appt on 09/01/23 with Jari Favre

## 2023-08-19 NOTE — Telephone Encounter (Signed)
Pt's medication was sent to pt's pharmacy as requested. Confirmation received.  °

## 2023-08-19 NOTE — Telephone Encounter (Signed)
  Patient is requesting that Dr Tenny Craw place lab orders for her labs she usually has done before her annual appointment. She is scheduled with Jari Favre on 09/01/23 and would like to have her labs done before her visit. Please send patient a MyChart message to let her know when orders have been placed so she can go to [facility]

## 2023-08-20 NOTE — Telephone Encounter (Signed)
Set up for lipomed panel, CBC and BMET

## 2023-08-20 NOTE — Telephone Encounter (Signed)
Orders have been placed. Will notify patient via MyChart.

## 2023-08-25 LAB — CBC

## 2023-08-26 ENCOUNTER — Encounter: Payer: Self-pay | Admitting: Internal Medicine

## 2023-08-26 LAB — NMR, LIPOPROFILE
Cholesterol, Total: 155 mg/dL (ref 100–199)
HDL Particle Number: 43.9 umol/L (ref >=30.5)
HDL-C: 70 mg/dL (ref >39)
LDL Particle Number: 783 nmol/L (ref <1000)
LDL Size: 20.6 nm (ref >20.5)
LDL-C (NIH Calc): 73 mg/dL (ref 0–99)
LP-IR Score: 26 (ref <=45)
Small LDL Particle Number: 312 nmol/L (ref <=527)
Triglycerides: 60 mg/dL (ref 0–149)

## 2023-08-26 LAB — CBC
Hematocrit: 39.3 % (ref 34.0–46.6)
Hemoglobin: 13 g/dL (ref 11.1–15.9)
MCH: 29.2 pg (ref 26.6–33.0)
MCHC: 33.1 g/dL (ref 31.5–35.7)
MCV: 88 fL (ref 79–97)
Platelets: 235 10*3/uL (ref 150–450)
RBC: 4.45 x10E6/uL (ref 3.77–5.28)
RDW: 12.6 % (ref 11.7–15.4)
WBC: 7.7 10*3/uL (ref 3.4–10.8)

## 2023-08-26 LAB — BASIC METABOLIC PANEL
BUN/Creatinine Ratio: 25 (ref 12–28)
BUN: 19 mg/dL (ref 8–27)
CO2: 20 mmol/L (ref 20–29)
Calcium: 9.5 mg/dL (ref 8.7–10.3)
Chloride: 105 mmol/L (ref 96–106)
Creatinine, Ser: 0.75 mg/dL (ref 0.57–1.00)
Glucose: 102 mg/dL — ABNORMAL HIGH (ref 70–99)
Potassium: 4.4 mmol/L (ref 3.5–5.2)
Sodium: 140 mmol/L (ref 134–144)
eGFR: 85 mL/min/{1.73_m2} (ref >59)

## 2023-08-31 NOTE — Progress Notes (Deleted)
  Cardiology Office Note:  .   Date:  08/31/2023  ID:  Heather Weaver, DOB 25-Aug-1950, MRN [id number] PCP: Pcp, No  [facility] Providers Cardiologist:  None {  History of Present Illness: .   Heather Weaver is a 73 y.o. female with a past medical history of coronary calcification on CT, Myoview  scan in 2017 showed normal perfusion, LVEF by echo 2017 was normal here for follow-up appointment.  Was seen a year ago by Dr. Okey and was doing okay from a cardiovascular standpoint.  Breathing was okay.  No chest pain or dizziness.  No palpitations.  Patient had recovered from COVID and tested negative the previous week.  Today, she***  ROS: Pertinent ROS in HPI  Studies Reviewed: .        *** Risk Assessment/Calculations:   {Does this patient have ATRIAL FIBRILLATION?[phone]} No BP recorded.  {Refresh Note OR Click here to enter BP  :1}***       Physical Exam:   VS:  There were no vitals taken for this visit.   Wt Readings from Last 3 Encounters:  07/31/22 183 lb 9.6 oz (83.3 kg)  07/31/21 184 lb (83.5 kg)  07/05/20 185 lb (83.9 kg)    GEN: Well nourished, well developed in no acute distress NECK: No JVD; No carotid bruits CARDIAC: ***RRR, no murmurs, rubs, gallops RESPIRATORY:  Clear to auscultation without rales, wheezing or rhonchi  ABDOMEN: Soft, non-tender, non-distended EXTREMITIES:  No edema; No deformity   ASSESSMENT AND PLAN: .   CAD HL Bradycardia Hypertension    {Are you ordering a CV Procedure (e.g. stress test, cath, DCCV, TEE, etc)?   Press F2        :[id number]}  Dispo: ***  Signed, Orren LOISE Fabry, PA-C

## 2023-09-01 ENCOUNTER — Ambulatory Visit: Payer: Medicare HMO | Admitting: Physician Assistant

## 2023-09-01 DIAGNOSIS — I2510 Atherosclerotic heart disease of native coronary artery without angina pectoris: Secondary | ICD-10-CM

## 2023-09-01 DIAGNOSIS — I10 Essential (primary) hypertension: Secondary | ICD-10-CM

## 2023-09-01 DIAGNOSIS — E785 Hyperlipidemia, unspecified: Secondary | ICD-10-CM

## 2023-10-14 NOTE — Progress Notes (Unsigned)
  Cardiology Office Note:  .   Date:  10/15/2023  ID:  Heather Weaver, DOB [birth year], MRN [id number] PCP: Pcp, No  [facility] Providers Cardiologist:  None {    History of Present Illness: .   Heather Weaver is a 73 y.o. female with a history of coronary calcification on CT and Myoview in 2017 with normal perfusion, normal LVEF on echo in 2017 here for follow-up appointment.  Seen on an annual basis by Dr. Tenny Craw.  Last time she was seen was doing well from a cardiac standpoint.  No shortness of breath.  No chest pain or dizziness.  No palpitations.  Patient had a history of COVID but tested negative the week prior to her appointment.  Today, she presents with a history of coronary artery disease and hypertension, presents for her annual check-up. She reports no issues with her current medication, Lipitor, and has been managing her blood pressure through diet and exercise. She has not been informed of any issues with bradycardia in the past and her heart rate is currently within normal limits. She has recently had labs done, including cholesterol and kidney tests, which have shown satisfactory results. The patient is currently without a primary care provider and is seeking recommendations within the [facility] system. She also has a procedural question regarding the scheduling of her annual check-up for the following year.  Reports no shortness of breath nor dyspnea on exertion. Reports no chest pain, pressure, or tightness. No edema, orthopnea, PND. Reports no palpitations.   Discussed the use of AI scribe software for clinical note transcription with the patient, who gave verbal consent to proceed.  ROS: Pertinent ROS in HPI  Studies Reviewed: Marland Kitchen        No recent testing.    Physical Exam:   VS:  BP 110/70   Pulse 64   Resp (!) 98   Ht 5' 6.5" (1.689 m)   Wt 182 lb 9.6 oz (82.8 kg)   BMI 29.03 kg/m    Wt Readings from Last 3 Encounters:  10/15/23 182 lb 9.6 oz (82.8 kg)   07/31/22 183 lb 9.6 oz (83.3 kg)  07/31/21 184 lb (83.5 kg)    GEN: Well nourished, well developed in no acute distress NECK: No JVD; No carotid bruits CARDIAC: RRR, no murmurs, rubs, gallops RESPIRATORY:  Clear to auscultation without rales, wheezing or rhonchi  ABDOMEN: Soft, non-tender, non-distended EXTREMITIES:  No edema; No deformity   ASSESSMENT AND PLAN: .    Coronary artery disease Coronary artery disease stable with controlled lipid levels on Lipitor. - Continue current dose of Lipitor. -recent labs reviewed today with the patient   Hypertension Blood pressure well-controlled with lifestyle modifications. - Continue lifestyle modifications for blood pressure management.      Dispo: She can follow-up in a year with Dr. Tenny Craw  Signed, Sharlene Dory, PA-C

## 2023-10-15 ENCOUNTER — Ambulatory Visit: Payer: Medicare HMO | Attending: Physician Assistant | Admitting: Physician Assistant

## 2023-10-15 ENCOUNTER — Encounter: Payer: Self-pay | Admitting: Physician Assistant

## 2023-10-15 VITALS — BP 110/70 | HR 64 | Resp 98 | Ht 66.5 in | Wt 182.6 lb

## 2023-10-15 DIAGNOSIS — E782 Mixed hyperlipidemia: Secondary | ICD-10-CM

## 2023-10-15 DIAGNOSIS — I10 Essential (primary) hypertension: Secondary | ICD-10-CM | POA: Diagnosis not present

## 2023-10-15 DIAGNOSIS — R001 Bradycardia, unspecified: Secondary | ICD-10-CM

## 2023-10-15 DIAGNOSIS — I2510 Atherosclerotic heart disease of native coronary artery without angina pectoris: Secondary | ICD-10-CM | POA: Diagnosis not present

## 2023-10-15 MED ORDER — ATORVASTATIN CALCIUM 40 MG PO TABS: 40.0000 mg | ORAL_TABLET | Freq: Every day | ORAL | 3 refills | Status: DC

## 2023-10-15 NOTE — Patient Instructions (Signed)
 Medication Instructions:  Refilled Lipitor Your physician recommends that you continue on your current medications as directed. Please refer to the Current Medication list given to you today.  *If you need a refill on your cardiac medications before your next appointment, please call your pharmacy*   Lab Work: NONE If you have labs (blood work) drawn today and your tests are completely normal, you will receive your results only by: MyChart Message (if you have MyChart) OR A paper copy in the mail If you have any lab test that is abnormal or we need to change your treatment, we will call you to review the results.   Testing/Procedures: NONE   Follow-Up: At [facility], you and your health needs are our priority.  As part of our continuing mission to provide you with exceptional heart care, we have created designated Provider Care Teams.  These Care Teams include your primary Cardiologist (physician) and Advanced Practice Providers (APPs -  Physician Assistants and Nurse Practitioners) who all work together to provide you with the care you need, when you need it.  We recommend signing up for the patient portal called "MyChart".  Sign up information is provided on this After Visit Summary.  MyChart is used to connect with patients for Virtual Visits (Telemedicine).  Patients are able to view lab/test results, encounter notes, upcoming appointments, etc.  Non-urgent messages can be sent to your provider as well.   To learn more about what you can do with MyChart, go to [email or web address].    Your next appointment:   1 year(s)  Provider:   Tenny Craw, MD  Other Instructions   1st Floor: - Lobby - Registration  - Pharmacy  - Lab - Cafe  2nd Floor: - PV Lab - Diagnostic Testing (echo, CT, nuclear med)  3rd Floor: - Vacant  4th Floor: - TCTS (cardiothoracic surgery) - AFib Clinic - Structural Heart Clinic - Vascular Surgery  - Vascular Ultrasound  5th Floor: -  HeartCare Cardiology (general and EP) - Clinical Pharmacy for coumadin, hypertension, lipid, weight-loss medications, and med management appointments    Valet parking services will be available as well.

## 2024-04-17 ENCOUNTER — Other Ambulatory Visit (HOSPITAL_BASED_OUTPATIENT_CLINIC_OR_DEPARTMENT_OTHER): Payer: Self-pay

## 2024-04-17 MED ORDER — COMIRNATY 30 MCG/0.3ML IM SUSY
0.3000 mL | PREFILLED_SYRINGE | Freq: Once | INTRAMUSCULAR | 0 refills | Status: AC
  Filled 2024-04-17: qty 0.3, 1d supply, fill #0

## 2024-05-04 ENCOUNTER — Other Ambulatory Visit (HOSPITAL_BASED_OUTPATIENT_CLINIC_OR_DEPARTMENT_OTHER): Payer: Self-pay

## 2024-05-04 MED ORDER — FLUZONE HIGH-DOSE 0.5 ML IM SUSY
0.5000 mL | PREFILLED_SYRINGE | Freq: Once | INTRAMUSCULAR | 0 refills | Status: AC
Start: 2024-05-04 — End: 2024-05-05
  Filled 2024-05-04: qty 0.5, 1d supply, fill #0

## 2024-05-19 ENCOUNTER — Other Ambulatory Visit (HOSPITAL_BASED_OUTPATIENT_CLINIC_OR_DEPARTMENT_OTHER): Payer: Self-pay

## 2024-05-19 MED ORDER — SHINGRIX 50 MCG/0.5ML IM SUSR
0.5000 mL | Freq: Once | INTRAMUSCULAR | 0 refills | Status: AC
  Filled 2024-05-19: qty 0.5, 1d supply, fill #0

## 2024-08-01 ENCOUNTER — Other Ambulatory Visit (HOSPITAL_BASED_OUTPATIENT_CLINIC_OR_DEPARTMENT_OTHER): Payer: Self-pay

## 2024-08-01 MED ORDER — SHINGRIX 50 MCG/0.5ML IM SUSR
INTRAMUSCULAR | 0 refills | Status: AC
  Filled 2024-08-01: qty 1, 1d supply, fill #0

## 2024-08-15 ENCOUNTER — Telehealth: Payer: Self-pay | Admitting: Internal Medicine

## 2024-08-15 DIAGNOSIS — I2510 Atherosclerotic heart disease of native coronary artery without angina pectoris: Secondary | ICD-10-CM

## 2024-08-15 DIAGNOSIS — E782 Mixed hyperlipidemia: Secondary | ICD-10-CM

## 2024-08-15 NOTE — Telephone Encounter (Signed)
" °  Per MyChart scheduling message:  Patient would like to get any needed labs done prior to her April appointment. There are no orders at this time. Please advise. Patient would like to communicate via MyChart. "

## 2024-08-28 ENCOUNTER — Other Ambulatory Visit: Payer: Self-pay | Admitting: Physician Assistant

## 2024-08-30 NOTE — Progress Notes (Signed)
 Heather Weaver                                          MRN: [id number]   08/30/2024   The [facility] Quality Team Specialist reviewed this patient medical record for the purposes of chart review for care gap closure. The following were reviewed: chart review for care gap closure-breast cancer screening.    VBCI Quality Team

## 2024-10-26 ENCOUNTER — Ambulatory Visit: Admitting: Physician Assistant
